# Patient Record
Sex: Female | Born: 1975 | Race: White | Hispanic: No | Marital: Married | Smoking: Former smoker
Health system: Southern US, Community
[De-identification: ages and names within clinical notes are randomized; demographics above are authoritative.]

## PROBLEM LIST (undated history)

## (undated) DIAGNOSIS — F32A Depression, unspecified: Secondary | ICD-10-CM

## (undated) DIAGNOSIS — F1020 Alcohol dependence, uncomplicated: Secondary | ICD-10-CM

## (undated) DIAGNOSIS — F329 Major depressive disorder, single episode, unspecified: Secondary | ICD-10-CM

## (undated) DIAGNOSIS — F4310 Post-traumatic stress disorder, unspecified: Secondary | ICD-10-CM

## (undated) DIAGNOSIS — F419 Anxiety disorder, unspecified: Secondary | ICD-10-CM

## (undated) DIAGNOSIS — F319 Bipolar disorder, unspecified: Secondary | ICD-10-CM

## (undated) DIAGNOSIS — J45909 Unspecified asthma, uncomplicated: Secondary | ICD-10-CM

## (undated) DIAGNOSIS — R569 Unspecified convulsions: Secondary | ICD-10-CM

## (undated) DIAGNOSIS — F1921 Other psychoactive substance dependence, in remission: Secondary | ICD-10-CM

## (undated) DIAGNOSIS — Z915 Personal history of self-harm: Secondary | ICD-10-CM

## (undated) HISTORY — PX: CHOLECYSTECTOMY: SHX55

## (undated) HISTORY — PX: TONSILLECTOMY: SUR1361

## (undated) HISTORY — PX: OTHER SURGICAL HISTORY: SHX169

---

## 1998-01-23 ENCOUNTER — Ambulatory Visit (HOSPITAL_COMMUNITY): Admission: RE | Admit: 1998-01-23 | Discharge: 1998-01-23 | Payer: Self-pay | Admitting: Obstetrics and Gynecology

## 1998-06-27 ENCOUNTER — Ambulatory Visit (HOSPITAL_COMMUNITY): Admission: RE | Admit: 1998-06-27 | Discharge: 1998-06-27 | Payer: Self-pay | Admitting: Obstetrics and Gynecology

## 1998-06-28 ENCOUNTER — Inpatient Hospital Stay (HOSPITAL_COMMUNITY): Admission: AD | Admit: 1998-06-28 | Discharge: 1998-07-01 | Payer: Self-pay | Admitting: Obstetrics and Gynecology

## 2003-12-03 DIAGNOSIS — Z9151 Personal history of suicidal behavior: Secondary | ICD-10-CM

## 2003-12-03 HISTORY — DX: Personal history of suicidal behavior: Z91.51

## 2006-10-06 ENCOUNTER — Other Ambulatory Visit: Admission: RE | Admit: 2006-10-06 | Discharge: 2006-10-06 | Payer: Self-pay | Admitting: Gynecology

## 2009-10-02 ENCOUNTER — Ambulatory Visit (HOSPITAL_BASED_OUTPATIENT_CLINIC_OR_DEPARTMENT_OTHER): Admission: RE | Admit: 2009-10-02 | Discharge: 2009-10-02 | Payer: Self-pay | Admitting: Internal Medicine

## 2009-10-02 ENCOUNTER — Ambulatory Visit: Payer: Self-pay | Admitting: Diagnostic Radiology

## 2009-10-02 ENCOUNTER — Ambulatory Visit: Payer: Self-pay | Admitting: Family

## 2009-10-02 DIAGNOSIS — Z9189 Other specified personal risk factors, not elsewhere classified: Secondary | ICD-10-CM

## 2009-10-02 DIAGNOSIS — R5383 Other fatigue: Secondary | ICD-10-CM

## 2009-10-02 DIAGNOSIS — R209 Unspecified disturbances of skin sensation: Secondary | ICD-10-CM

## 2009-10-02 DIAGNOSIS — R413 Other amnesia: Secondary | ICD-10-CM

## 2009-10-02 DIAGNOSIS — F064 Anxiety disorder due to known physiological condition: Secondary | ICD-10-CM | POA: Insufficient documentation

## 2009-10-02 DIAGNOSIS — R5381 Other malaise: Secondary | ICD-10-CM | POA: Insufficient documentation

## 2009-10-02 DIAGNOSIS — N879 Dysplasia of cervix uteri, unspecified: Secondary | ICD-10-CM | POA: Insufficient documentation

## 2009-10-02 LAB — CONVERTED CEMR LAB
BUN: 9 mg/dL (ref 6–23)
Basophils Absolute: 0.1 10*3/uL (ref 0.0–0.1)
Calcium: 9.3 mg/dL (ref 8.4–10.5)
Chloride: 108 meq/L (ref 96–112)
Glucose, Bld: 75 mg/dL (ref 70–99)
Lymphocytes Relative: 20 % (ref 12–46)
Lymphs Abs: 1.8 10*3/uL (ref 0.7–4.0)
MCHC: 33.6 g/dL (ref 30.0–36.0)
Monocytes Absolute: 0.6 10*3/uL (ref 0.1–1.0)
Monocytes Relative: 6 % (ref 3–12)
Neutro Abs: 6 10*3/uL (ref 1.7–7.7)
Potassium: 4.4 meq/L (ref 3.5–5.3)
RDW: 12.3 % (ref 11.5–15.5)

## 2009-10-03 ENCOUNTER — Encounter: Payer: Self-pay | Admitting: Family

## 2010-02-15 ENCOUNTER — Emergency Department (HOSPITAL_COMMUNITY): Admission: EM | Admit: 2010-02-15 | Discharge: 2010-02-16 | Payer: Self-pay | Admitting: Emergency Medicine

## 2011-01-29 ENCOUNTER — Emergency Department (INDEPENDENT_AMBULATORY_CARE_PROVIDER_SITE_OTHER): Payer: Self-pay

## 2011-01-29 ENCOUNTER — Emergency Department (HOSPITAL_BASED_OUTPATIENT_CLINIC_OR_DEPARTMENT_OTHER)
Admission: EM | Admit: 2011-01-29 | Discharge: 2011-01-29 | Disposition: A | Discharge status: Home / Self Care | Payer: Self-pay | Attending: Emergency Medicine | Admitting: Emergency Medicine

## 2011-01-29 DIAGNOSIS — R0989 Other specified symptoms and signs involving the circulatory and respiratory systems: Secondary | ICD-10-CM

## 2011-01-29 DIAGNOSIS — F341 Dysthymic disorder: Secondary | ICD-10-CM | POA: Insufficient documentation

## 2011-01-29 DIAGNOSIS — R059 Cough, unspecified: Secondary | ICD-10-CM | POA: Insufficient documentation

## 2011-01-29 DIAGNOSIS — R197 Diarrhea, unspecified: Secondary | ICD-10-CM | POA: Insufficient documentation

## 2011-01-29 DIAGNOSIS — R05 Cough: Secondary | ICD-10-CM

## 2011-01-29 DIAGNOSIS — R112 Nausea with vomiting, unspecified: Secondary | ICD-10-CM | POA: Insufficient documentation

## 2011-01-29 DIAGNOSIS — R0602 Shortness of breath: Secondary | ICD-10-CM | POA: Insufficient documentation

## 2011-01-29 LAB — BASIC METABOLIC PANEL
Creatinine, Ser: 1 mg/dL (ref 0.4–1.2)
GFR calc non Af Amer: >60 mL/min (ref >60)
Glucose, Bld: 108 mg/dL — ABNORMAL HIGH (ref 70–99)
Potassium: 3.6 mEq/L (ref 3.5–5.1)

## 2011-01-29 LAB — PREGNANCY, URINE: Preg Test, Ur: NEGATIVE

## 2011-01-29 LAB — URINE MICROSCOPIC-ADD ON

## 2011-01-29 LAB — URINALYSIS, ROUTINE W REFLEX MICROSCOPIC
Ketones, ur: NEGATIVE mg/dL
Nitrite: NEGATIVE
Protein, ur: 30 mg/dL — AB
Urine Glucose, Fasting: NEGATIVE mg/dL
Urobilinogen, UA: 0.2 mg/dL (ref 0.0–1.0)

## 2011-02-25 LAB — URINALYSIS, ROUTINE W REFLEX MICROSCOPIC
Glucose, UA: NEGATIVE mg/dL
Ketones, ur: NEGATIVE mg/dL
Leukocytes, UA: NEGATIVE
Nitrite: NEGATIVE
Protein, ur: NEGATIVE mg/dL
Urobilinogen, UA: 1 mg/dL (ref 0.0–1.0)

## 2011-02-25 LAB — ACETAMINOPHEN LEVEL: Acetaminophen (Tylenol), Serum: <10 ug/mL — ABNORMAL LOW (ref 10–30)

## 2011-02-25 LAB — COMPREHENSIVE METABOLIC PANEL
ALT: 10 U/L (ref 0–35)
Alkaline Phosphatase: 54 U/L (ref 39–117)
BUN: 8 mg/dL (ref 6–23)
CO2: 28 mEq/L (ref 19–32)
Calcium: 8.9 mg/dL (ref 8.4–10.5)
Creatinine, Ser: 0.94 mg/dL (ref 0.4–1.2)
GFR calc Af Amer: >60 mL/min (ref >60)
Glucose, Bld: 97 mg/dL (ref 70–99)

## 2011-02-25 LAB — DIFFERENTIAL
Basophils Relative: 1 % (ref 0–1)
Eosinophils Absolute: 0.1 10*3/uL (ref 0.0–0.7)
Eosinophils Relative: 1 % (ref 0–5)
Lymphs Abs: 1.3 10*3/uL (ref 0.7–4.0)
Monocytes Absolute: 0.4 10*3/uL (ref 0.1–1.0)
Neutro Abs: 8.7 10*3/uL — ABNORMAL HIGH (ref 1.7–7.7)

## 2011-02-25 LAB — URINE MICROSCOPIC-ADD ON

## 2011-02-25 LAB — CBC
HCT: 46.6 % — ABNORMAL HIGH (ref 36.0–46.0)
MCV: 88.4 fL (ref 78.0–100.0)
RBC: 5.28 MIL/uL — ABNORMAL HIGH (ref 3.87–5.11)
WBC: 10.5 10*3/uL (ref 4.0–10.5)

## 2011-02-25 LAB — RAPID URINE DRUG SCREEN, HOSP PERFORMED: Amphetamines: NOT DETECTED

## 2011-02-25 LAB — POCT PREGNANCY, URINE: Preg Test, Ur: NEGATIVE

## 2011-02-25 LAB — SALICYLATE LEVEL: Salicylate Lvl: <4 mg/dL (ref 2.8–20.0)

## 2011-08-01 ENCOUNTER — Other Ambulatory Visit (HOSPITAL_COMMUNITY)
Admission: RE | Admit: 2011-08-01 | Discharge: 2011-08-01 | Disposition: A | Discharge status: Home / Self Care | Payer: 59 | Source: Ambulatory Visit | Attending: Internal Medicine | Admitting: Internal Medicine

## 2011-08-01 DIAGNOSIS — Z01419 Encounter for gynecological examination (general) (routine) without abnormal findings: Secondary | ICD-10-CM | POA: Insufficient documentation

## 2012-12-03 ENCOUNTER — Emergency Department (HOSPITAL_BASED_OUTPATIENT_CLINIC_OR_DEPARTMENT_OTHER)
Admission: EM | Admit: 2012-12-03 | Discharge: 2012-12-04 | Disposition: A | Discharge status: Home / Self Care | Payer: 59 | Attending: Emergency Medicine | Admitting: Emergency Medicine

## 2012-12-03 ENCOUNTER — Encounter (HOSPITAL_BASED_OUTPATIENT_CLINIC_OR_DEPARTMENT_OTHER): Payer: Self-pay | Admitting: *Deleted

## 2012-12-03 DIAGNOSIS — F319 Bipolar disorder, unspecified: Secondary | ICD-10-CM | POA: Insufficient documentation

## 2012-12-03 DIAGNOSIS — F3289 Other specified depressive episodes: Secondary | ICD-10-CM | POA: Insufficient documentation

## 2012-12-03 DIAGNOSIS — Z9089 Acquired absence of other organs: Secondary | ICD-10-CM | POA: Insufficient documentation

## 2012-12-03 DIAGNOSIS — F1921 Other psychoactive substance dependence, in remission: Secondary | ICD-10-CM | POA: Insufficient documentation

## 2012-12-03 DIAGNOSIS — Z87891 Personal history of nicotine dependence: Secondary | ICD-10-CM | POA: Insufficient documentation

## 2012-12-03 DIAGNOSIS — F329 Major depressive disorder, single episode, unspecified: Secondary | ICD-10-CM | POA: Insufficient documentation

## 2012-12-03 DIAGNOSIS — L02219 Cutaneous abscess of trunk, unspecified: Secondary | ICD-10-CM | POA: Insufficient documentation

## 2012-12-03 DIAGNOSIS — F411 Generalized anxiety disorder: Secondary | ICD-10-CM | POA: Insufficient documentation

## 2012-12-03 DIAGNOSIS — L03319 Cellulitis of trunk, unspecified: Secondary | ICD-10-CM | POA: Insufficient documentation

## 2012-12-03 DIAGNOSIS — F1020 Alcohol dependence, uncomplicated: Secondary | ICD-10-CM | POA: Insufficient documentation

## 2012-12-03 DIAGNOSIS — L0291 Cutaneous abscess, unspecified: Secondary | ICD-10-CM

## 2012-12-03 HISTORY — DX: Major depressive disorder, single episode, unspecified: F32.9

## 2012-12-03 HISTORY — DX: Alcohol dependence, uncomplicated: F10.20

## 2012-12-03 HISTORY — DX: Bipolar disorder, unspecified: F31.9

## 2012-12-03 HISTORY — DX: Anxiety disorder, unspecified: F41.9

## 2012-12-03 HISTORY — DX: Depression, unspecified: F32.A

## 2012-12-03 HISTORY — DX: Other psychoactive substance dependence, in remission: F19.21

## 2012-12-03 NOTE — ED Notes (Signed)
Abscess to her abdomen x 1 week. Family member was admitted with staph infection last week.

## 2012-12-04 MED ORDER — CEPHALEXIN 500 MG PO CAPS: 500.0000 mg | ORAL_CAPSULE | Freq: Four times a day (QID) | ORAL | Status: DC

## 2012-12-04 MED ORDER — DOXYCYCLINE HYCLATE 100 MG PO CAPS: 100.0000 mg | ORAL_CAPSULE | Freq: Two times a day (BID) | ORAL | Status: DC

## 2012-12-04 MED ORDER — LIDOCAINE-EPINEPHRINE 2 %-1:100000 IJ SOLN
INTRAMUSCULAR | Status: AC
  Administered 2012-12-04: 01:00:00
  Filled 2012-12-04: qty 1

## 2012-12-04 NOTE — ED Notes (Signed)
MD at bedside. 

## 2012-12-04 NOTE — ED Provider Notes (Addendum)
History     CSN: [id number]  Arrival date & time 12/03/12  2127   First MD Initiated Contact with Patient 12/03/12 2358      Chief Complaint  Patient presents with  . Abscess    (Consider location/radiation/quality/duration/timing/severity/associated sxs/prior treatment) Patient is a 37 y.o. female presenting with abscess. The history is provided by the patient. No language interpreter was used.  Abscess  This is a new problem. The current episode started more than one week ago. The onset was gradual. The problem occurs continuously. The problem has been gradually worsening. The abscess is present on the abdomen. The problem is moderate. The abscess is characterized by itchiness. Associated with: none. The abscess first occurred at home. Pertinent negatives include no anorexia and no fussiness. Her past medical history is significant for skin abscesses in family. There were no sick contacts. She has received no recent medical care.    Past Medical History  Diagnosis Date  . Anxiety   . Depression   . Alcoholic   . Drug addiction in remission   . Bipolar 1 disorder     Past Surgical History  Procedure Date  . Cholecystectomy   . Tonsillectomy     No family history on file.  History  Substance Use Topics  . Smoking status: Former Games developer  . Smokeless tobacco: Not on file  . Alcohol Use: No    OB History    Grav Para Term Preterm Abortions TAB SAB Ect Mult Living                  Review of Systems  Gastrointestinal: Negative for anorexia.  Skin: Positive for wound.  All other systems reviewed and are negative.    Allergies  Review of patient's allergies indicates no known allergies.  Home Medications   Current Outpatient Rx  Name  Route  Sig  Dispense  Refill  . WELLBUTRIN PO   Oral   Take by mouth.         . CYMBALTA PO   Oral   Take by mouth.         Marland Kitchen VISTARIL PO   Oral   Take by mouth.           BP 125/77  Pulse 95  Temp 98.2 F  (36.8 C) (Oral)  Resp 20  SpO2 98%  Physical Exam  Constitutional: She is oriented to person, place, and time. She appears well-developed and well-nourished. No distress.  HENT:  Head: Normocephalic and atraumatic.  Mouth/Throat: Oropharynx is clear and moist.  Eyes: Conjunctivae normal are normal. Pupils are equal, round, and reactive to light.  Neck: Neck supple.  Cardiovascular: Normal rate, regular rhythm and intact distal pulses.   Pulmonary/Chest: Effort normal and breath sounds normal.  Abdominal: Soft. Bowel sounds are normal. There is no tenderness. There is no rebound and no guarding.    Musculoskeletal: Normal range of motion.  Neurological: She is alert and oriented to person, place, and time.  Skin: Skin is warm and dry.  Psychiatric: She has a normal mood and affect.    ED Course  Procedures (including critical care time)  Labs Reviewed - No data to display No results found.   No diagnosis found.    MDM  INCISION AND DRAINAGE Performed by: Jasmine Awe Consent: Verbal consent obtained. Risks and benefits: risks, benefits and alternatives were discussed Type: abscess  Body area: right abdominal wall  Anesthesia: local infiltration, aspiration without blood prior to injection  Incision was made with a scalpel, very superficial incision as already draining.  Local anesthetic: lidocaine 1 % with epinephrine  Anesthetic total: 4 ml  Complexity: complex Blunt dissection to break up loculations  Drainage: purulent  Drainage amount: ~ 10 ml-- large   Packing material: 1/4 in iodoform gauze  Patient tolerance: Patient tolerated the procedure well with no immediate complications.    Return for packing removal in 48 hours.  Return sooner for fevers, streaking or any concerns.  Patient verbalizes understanding and agrees to follow up      Ople Girgis K Kayston Jodoin-Rasch, MD 12/04/12 0055  Chaniah Cisse K Keya Wynes-Rasch, MD 12/04/12 1914  Jasmine Awe, MD 12/04/12 [phone]

## 2012-12-05 ENCOUNTER — Emergency Department (HOSPITAL_BASED_OUTPATIENT_CLINIC_OR_DEPARTMENT_OTHER)
Admission: EM | Admit: 2012-12-05 | Discharge: 2012-12-05 | Disposition: A | Discharge status: Home / Self Care | Payer: 59 | Attending: Emergency Medicine | Admitting: Emergency Medicine

## 2012-12-05 ENCOUNTER — Encounter (HOSPITAL_BASED_OUTPATIENT_CLINIC_OR_DEPARTMENT_OTHER): Payer: Self-pay | Admitting: *Deleted

## 2012-12-05 DIAGNOSIS — Z5189 Encounter for other specified aftercare: Secondary | ICD-10-CM

## 2012-12-05 DIAGNOSIS — F319 Bipolar disorder, unspecified: Secondary | ICD-10-CM | POA: Insufficient documentation

## 2012-12-05 DIAGNOSIS — F411 Generalized anxiety disorder: Secondary | ICD-10-CM | POA: Insufficient documentation

## 2012-12-05 DIAGNOSIS — Z87891 Personal history of nicotine dependence: Secondary | ICD-10-CM | POA: Insufficient documentation

## 2012-12-05 DIAGNOSIS — F329 Major depressive disorder, single episode, unspecified: Secondary | ICD-10-CM | POA: Insufficient documentation

## 2012-12-05 DIAGNOSIS — F3289 Other specified depressive episodes: Secondary | ICD-10-CM | POA: Insufficient documentation

## 2012-12-05 DIAGNOSIS — F1020 Alcohol dependence, uncomplicated: Secondary | ICD-10-CM | POA: Insufficient documentation

## 2012-12-05 DIAGNOSIS — Z4800 Encounter for change or removal of nonsurgical wound dressing: Secondary | ICD-10-CM | POA: Insufficient documentation

## 2012-12-05 DIAGNOSIS — Z79899 Other long term (current) drug therapy: Secondary | ICD-10-CM | POA: Insufficient documentation

## 2012-12-05 DIAGNOSIS — F1921 Other psychoactive substance dependence, in remission: Secondary | ICD-10-CM | POA: Insufficient documentation

## 2012-12-05 NOTE — ED Provider Notes (Signed)
History     CSN: [id number]  Arrival date & time 12/05/12  1629   First MD Initiated Contact with Patient 12/05/12 1727      Chief Complaint  Patient presents with  . Wound Check    (Consider location/radiation/quality/duration/timing/severity/associated sxs/prior treatment) HPI Comments: 37 year old female presents to the emergency department for recheck and packing removal of an abscess under her abdomen that was drained 2 days ago. She was placed on doxycycline and Keflex and is taking both as prescribed. States the area today is more painful since she worked all day yesterday. She has not tried any alleviating factors for her pain. Denies drainage. She's been keeping the area clean and dry. No fever or chills.  Patient is a 37 y.o. female presenting with wound check. The history is provided by the patient.  Wound Check     Past Medical History  Diagnosis Date  . Anxiety   . Depression   . Alcoholic   . Drug addiction in remission   . Bipolar 1 disorder     Past Surgical History  Procedure Date  . Cholecystectomy   . Tonsillectomy     History reviewed. No pertinent family history.  History  Substance Use Topics  . Smoking status: Former Games developer  . Smokeless tobacco: Not on file  . Alcohol Use: No    OB History    Grav Para Term Preterm Abortions TAB SAB Ect Mult Living                  Review of Systems  Constitutional: Negative for fever and chills.  Gastrointestinal: Negative for nausea, vomiting and abdominal pain.  Musculoskeletal: Negative for arthralgias.  Skin: Negative for color change.    Allergies  Review of patient's allergies indicates no known allergies.  Home Medications   Current Outpatient Rx  Name  Route  Sig  Dispense  Refill  . WELLBUTRIN PO   Oral   Take by mouth.         . CEPHALEXIN 500 MG PO CAPS   Oral   Take 1 capsule (500 mg total) by mouth 4 (four) times daily.   28 capsule   0   . DOXYCYCLINE HYCLATE 100 MG PO  CAPS   Oral   Take 1 capsule (100 mg total) by mouth 2 (two) times daily. One po bid x 7 days   14 capsule   0   . CYMBALTA PO   Oral   Take by mouth.         Marland Kitchen VISTARIL PO   Oral   Take by mouth.           BP 132/66  Pulse 81  Temp 98.6 F (37 C) (Oral)  Resp 16  Ht 5\' 3"  (1.6 m)  Wt 176 lb (79.833 kg)  BMI 31.18 kg/m2  SpO2 100%  Physical Exam  Nursing note and vitals reviewed. Constitutional: She appears well-developed and well-nourished. No distress.  HENT:  Head: Normocephalic and atraumatic.  Eyes: Conjunctivae normal are normal.  Neck: Normal range of motion. Neck supple.  Cardiovascular: Normal rate, regular rhythm and normal heart sounds.   Pulmonary/Chest: Effort normal and breath sounds normal.  Abdominal: Soft. Bowel sounds are normal. There is no tenderness.    Musculoskeletal: Normal range of motion. She exhibits no edema.  Neurological: She is alert.  Skin: Skin is warm and dry.       See abdomen.  Psychiatric: She has a normal mood and affect.  Her behavior is normal.    ED Course  Procedures (including critical care time)  Labs Reviewed - No data to display No results found.   1. Wound check, abscess       MDM  37 y/o female with well healing abscess. Packing removed. No active drainage. Afebrile. Advised warm compresses. Return precautions discussed. Aware to complete antibiotics. Patient states understanding of plan and is agreeable.         Trevor Mace, PA-C 12/05/12 1747

## 2012-12-05 NOTE — ED Notes (Signed)
Pt here for wound recheck last seen x 2 days ago

## 2012-12-05 NOTE — ED Provider Notes (Signed)
Medical screening examination/treatment/procedure(s) were performed by non-physician practitioner and as supervising physician I was immediately available for consultation/collaboration.   Deneise Getty, MD 12/05/12 2347 

## 2012-12-05 NOTE — ED Notes (Signed)
Wound care discussed. Instructed to finish antibiotics- pt verbalizes understanding

## 2013-01-05 ENCOUNTER — Emergency Department (HOSPITAL_COMMUNITY)
Admission: EM | Admit: 2013-01-05 | Discharge: 2013-01-06 | Disposition: A | Discharge status: Other Acute Inpatient Hospital | Payer: Self-pay | Attending: Emergency Medicine | Admitting: Emergency Medicine

## 2013-01-05 ENCOUNTER — Encounter (HOSPITAL_COMMUNITY): Payer: Self-pay

## 2013-01-05 DIAGNOSIS — F32A Depression, unspecified: Secondary | ICD-10-CM

## 2013-01-05 DIAGNOSIS — F22 Delusional disorders: Secondary | ICD-10-CM

## 2013-01-05 DIAGNOSIS — F329 Major depressive disorder, single episode, unspecified: Secondary | ICD-10-CM

## 2013-01-05 DIAGNOSIS — R45851 Suicidal ideations: Secondary | ICD-10-CM

## 2013-01-05 DIAGNOSIS — Z87891 Personal history of nicotine dependence: Secondary | ICD-10-CM | POA: Insufficient documentation

## 2013-01-05 DIAGNOSIS — F3013 Manic episode, severe, without psychotic symptoms: Secondary | ICD-10-CM | POA: Insufficient documentation

## 2013-01-05 DIAGNOSIS — F3010 Manic episode without psychotic symptoms, unspecified: Secondary | ICD-10-CM

## 2013-01-05 DIAGNOSIS — F064 Anxiety disorder due to known physiological condition: Secondary | ICD-10-CM

## 2013-01-05 DIAGNOSIS — Z79899 Other long term (current) drug therapy: Secondary | ICD-10-CM | POA: Insufficient documentation

## 2013-01-05 DIAGNOSIS — F1921 Other psychoactive substance dependence, in remission: Secondary | ICD-10-CM | POA: Insufficient documentation

## 2013-01-05 DIAGNOSIS — F1020 Alcohol dependence, uncomplicated: Secondary | ICD-10-CM | POA: Insufficient documentation

## 2013-01-05 DIAGNOSIS — F4310 Post-traumatic stress disorder, unspecified: Secondary | ICD-10-CM

## 2013-01-05 LAB — CBC WITH DIFFERENTIAL/PLATELET
Basophils Absolute: 0.1 10*3/uL (ref 0.0–0.1)
Basophils Relative: 1 % (ref 0–1)
Eosinophils Absolute: 0.2 10*3/uL (ref 0.0–0.7)
Eosinophils Relative: 1 % (ref 0–5)
MCH: 28.7 pg (ref 26.0–34.0)
MCHC: 33.1 g/dL (ref 30.0–36.0)
MCV: 86.5 fL (ref 78.0–100.0)
Monocytes Absolute: 1.2 10*3/uL — ABNORMAL HIGH (ref 0.1–1.0)
Platelets: 314 10*3/uL (ref 150–400)
RDW: 12.9 % (ref 11.5–15.5)
WBC: 12.6 10*3/uL — ABNORMAL HIGH (ref 4.0–10.5)

## 2013-01-05 LAB — COMPREHENSIVE METABOLIC PANEL
ALT: 10 U/L (ref 0–35)
AST: 11 U/L (ref 0–37)
Calcium: 8.7 mg/dL (ref 8.4–10.5)
Creatinine, Ser: 1.06 mg/dL (ref 0.50–1.10)
Sodium: 138 mEq/L (ref 135–145)
Total Protein: 7.3 g/dL (ref 6.0–8.3)

## 2013-01-05 LAB — RAPID URINE DRUG SCREEN, HOSP PERFORMED
Amphetamines: NOT DETECTED
Barbiturates: NOT DETECTED
Benzodiazepines: NOT DETECTED
Cocaine: NOT DETECTED

## 2013-01-05 LAB — ETHANOL: Alcohol, Ethyl (B): <11 mg/dL (ref 0–11)

## 2013-01-05 MED ORDER — ALUM & MAG HYDROXIDE-SIMETH 200-200-20 MG/5ML PO SUSP
30.0000 mL | ORAL | Status: DC | PRN
Start: 2013-01-05 — End: 2013-01-06

## 2013-01-05 MED ORDER — ZOLPIDEM TARTRATE 5 MG PO TABS
5.0000 mg | ORAL_TABLET | Freq: Every evening | ORAL | Status: DC | PRN
  Administered 2013-01-05: 5 mg via ORAL
  Filled 2013-01-05: qty 1

## 2013-01-05 MED ORDER — BUPROPION HCL ER (SR) 150 MG PO TB12
150.0000 mg | ORAL_TABLET | Freq: Two times a day (BID) | ORAL | Status: DC
  Administered 2013-01-06 (×2): 150 mg via ORAL
  Filled 2013-01-05 (×3): qty 1

## 2013-01-05 MED ORDER — DULOXETINE HCL 30 MG PO CPEP
30.0000 mg | ORAL_CAPSULE | Freq: Every day | ORAL | Status: DC
  Administered 2013-01-06: 30 mg via ORAL
  Filled 2013-01-05: qty 1

## 2013-01-05 MED ORDER — LORAZEPAM 2 MG/ML IJ SOLN
1.0000 mg | Freq: Once | INTRAMUSCULAR | Status: AC
  Administered 2013-01-05: 1 mg via INTRAMUSCULAR
  Filled 2013-01-05: qty 1

## 2013-01-05 MED ORDER — ONDANSETRON HCL 4 MG PO TABS: 4.0000 mg | ORAL_TABLET | Freq: Three times a day (TID) | ORAL | Status: DC | PRN

## 2013-01-05 MED ORDER — ACETAMINOPHEN 325 MG PO TABS: 650.0000 mg | ORAL_TABLET | ORAL | Status: DC | PRN

## 2013-01-05 MED ORDER — NICOTINE 21 MG/24HR TD PT24
21.0000 mg | MEDICATED_PATCH | Freq: Every day | TRANSDERMAL | Status: DC
  Filled 2013-01-05: qty 1

## 2013-01-05 MED ORDER — IBUPROFEN 600 MG PO TABS: 600.0000 mg | ORAL_TABLET | Freq: Three times a day (TID) | ORAL | Status: DC | PRN

## 2013-01-05 NOTE — Progress Notes (Signed)
Pt accepted to [facility] pending 400 hall bed. Per discussion with Minerva Areola, bed anticipated to be available tomorrow. CSW will inform patient.   Catha Gosselin, LCSWA  [phone] .01/05/2013 10:29pm

## 2013-01-05 NOTE — ED Provider Notes (Signed)
History   This chart was scribed for Loretta Hood, a non-physician practitioner working with Loretta Hood, * by Lewanda Rife, ED Scribe. This patient was seen in room [facility] and the patient's care was started at 3:59 pm.     CSN: [id number]  Arrival date & time 01/05/13  1439   None     Chief Complaint  Patient presents with  . Depression  . Medical Clearance  . Suicidal    (Consider location/radiation/quality/duration/timing/severity/associated sxs/prior treatment) HPI [name] is a 37 y.o. female who presents to the Emergency Department complaining of worsening depression onset 3 weeks. Pt reports she is afraid all the time and does not know why. Pt reports she called her therapist today "with a crisis." Pt reports visual hallucinations, paranoia, insomnia, anxiety, depression, and suicidal ideations. Pt denies homicidal ideations. Pt states precipitating factor was in June when she had a breakdown after her boyfriend broke up with her. Pt reports she's a recovering addict and had a relapse in November. Pt reports medications were recently changed and states her symptoms are not improving.    Past Medical History  Diagnosis Date  . Anxiety   . Depression   . Alcoholic   . Drug addiction in remission   . Bipolar 1 disorder     Past Surgical History  Procedure Date  . Cholecystectomy   . Tonsillectomy     No family history on file.  History  Substance Use Topics  . Smoking status: Former Games developer  . Smokeless tobacco: Never Used  . Alcohol Use: No    OB History    Grav Para Term Preterm Abortions TAB SAB Ect Mult Living                  Review of Systems  Constitutional: Negative.   HENT: Negative.   Respiratory: Negative.   Cardiovascular: Negative.   Gastrointestinal: Negative.   Musculoskeletal: Negative.   Skin: Negative.   Neurological: Negative.   Hematological: Negative.   Psychiatric/Behavioral: Positive for  suicidal ideas and hallucinations. The patient is nervous/anxious and is hyperactive.   All other systems reviewed and are negative.   A complete 10 system review of systems was obtained and all systems are negative except as noted in the HPI and PMH.    Allergies  Review of patient's allergies indicates no known allergies.  Home Medications   Current Outpatient Rx  Name  Route  Sig  Dispense  Refill  . BUPROPION HCL ER (SR) 150 MG PO TB12   Oral   Take 150 mg by mouth 2 (two) times daily.         . WELLBUTRIN PO   Oral   Take by mouth.         . CEPHALEXIN 500 MG PO CAPS   Oral   Take 1 capsule (500 mg total) by mouth 4 (four) times daily.   28 capsule   0   . DULOXETINE HCL 30 MG PO CPEP   Oral   Take 30 mg by mouth daily.         Marland Kitchen HYDROXYZINE HCL 25 MG PO TABS   Oral   Take 25 mg by mouth every 4 (four) hours as needed.         Marland Kitchen DOXYCYCLINE HYCLATE 100 MG PO CAPS   Oral   Take 1 capsule (100 mg total) by mouth 2 (two) times daily. One po bid x 7 days   14 capsule  0     BP 138/91  Pulse 117  Temp 98.9 F (37.2 C) (Oral)  Resp 22  SpO2 96%  LMP 12/05/2012  Physical Exam  Nursing note and vitals reviewed. Constitutional: She is oriented to person, place, and time. She appears well-developed and well-nourished. No distress.  HENT:  Head: Normocephalic and atraumatic.  Eyes: EOM are normal.  Neck: Neck supple. No tracheal deviation present.  Cardiovascular: Tachycardia present.   Pulmonary/Chest: Effort normal. No respiratory distress.  Abdominal: Soft.  Musculoskeletal: Normal range of motion.  Neurological: She is alert and oriented to person, place, and time.  Skin: Skin is warm and dry.  Psychiatric: Her mood appears anxious. Her speech is rapid and/or pressured. She is is hyperactive and actively hallucinating. Thought content is paranoid. She expresses suicidal ideation. She expresses no homicidal ideation. She expresses no suicidal  plans and no homicidal plans.    ED Course  Procedures (including critical care time)  Medications  buPROPion (WELLBUTRIN SR) 150 MG 12 hr tablet (not administered)  hydrOXYzine (ATARAX/VISTARIL) 25 MG tablet (not administered)  DULoxetine (CYMBALTA) 30 MG capsule (not administered)  LORazepam (ATIVAN) injection 1 mg (not administered)  ibuprofen (ADVIL,MOTRIN) tablet 600 mg (not administered)  acetaminophen (TYLENOL) tablet 650 mg (not administered)  zolpidem (AMBIEN) tablet 5 mg (not administered)  nicotine (NICODERM CQ - dosed in mg/24 hours) patch 21 mg (not administered)  ondansetron (ZOFRAN) tablet 4 mg (not administered)  alum & mag hydroxide-simeth (MAALOX/MYLANTA) 200-200-20 MG/5ML suspension 30 mL (not administered)    Labs Reviewed  CBC WITH DIFFERENTIAL - Abnormal; Notable for the following:    WBC 12.6 (*)     RBC 5.27 (*)     Hemoglobin 15.1 (*)     Neutro Abs 9.1 (*)     Monocytes Absolute 1.2 (*)     All other components within normal limits  COMPREHENSIVE METABOLIC PANEL - Abnormal; Notable for the following:    Glucose, Bld 128 (*)     GFR calc non Af Amer 66 (*)     GFR calc Af Amer 77 (*)     All other components within normal limits  ETHANOL  URINE RAPID DRUG SCREEN (HOSP PERFORMED)  PREGNANCY, URINE   No results found.   1. Manic behavior   2. Paranoid   3. PTSD (post-traumatic stress disorder)   4. Suicidal ideation   5. Depression       MDM  Holding orders placed. ACT consulted.  2mg  IM Ativan for anxiety ordered.  I personally performed the services described in this documentation, which was scribed in my presence. The recorded information has been reviewed and is accurate.    Dorthula Matas, PA 01/05/13 [phone]

## 2013-01-05 NOTE — ED Notes (Signed)
Patient reports that she was sent here by her therapist. Patient states she is on the verge of a nervous breakdown and repeated several times that she did not want to live llike this. Patient has stuttering speech, tearful and states she does not want anyone touching her. Patient denies HI. Patient states her medicine was changed 3 weeks ago and states her symptoms of depression have gotten worse.
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
## 2013-01-05 NOTE — ED Notes (Signed)
Family took belongings bags x 2 home.

## 2013-01-05 NOTE — ED Provider Notes (Signed)
Medical screening examination/treatment/procedure(s) were performed by non-physician practitioner and as supervising physician I was immediately available for consultation/collaboration.   [name], MD 01/05/13 [phone]

## 2013-01-06 ENCOUNTER — Inpatient Hospital Stay (HOSPITAL_COMMUNITY)
Admission: AD | Admit: 2013-01-06 | Discharge: 2013-01-15 | DRG: 881 | Disposition: A | Discharge status: Home / Self Care | Payer: Federal, State, Local not specified - Other | Source: Intra-hospital | Attending: Psychiatry | Admitting: Psychiatry

## 2013-01-06 ENCOUNTER — Encounter (HOSPITAL_COMMUNITY): Payer: Self-pay | Admitting: Emergency Medicine

## 2013-01-06 DIAGNOSIS — F419 Anxiety disorder, unspecified: Secondary | ICD-10-CM

## 2013-01-06 DIAGNOSIS — F1910 Other psychoactive substance abuse, uncomplicated: Secondary | ICD-10-CM

## 2013-01-06 DIAGNOSIS — F4310 Post-traumatic stress disorder, unspecified: Secondary | ICD-10-CM

## 2013-01-06 DIAGNOSIS — F3289 Other specified depressive episodes: Principal | ICD-10-CM | POA: Diagnosis present

## 2013-01-06 DIAGNOSIS — Z79899 Other long term (current) drug therapy: Secondary | ICD-10-CM

## 2013-01-06 DIAGNOSIS — F329 Major depressive disorder, single episode, unspecified: Secondary | ICD-10-CM

## 2013-01-06 DIAGNOSIS — F411 Generalized anxiety disorder: Secondary | ICD-10-CM | POA: Diagnosis present

## 2013-01-06 DIAGNOSIS — F32A Depression, unspecified: Secondary | ICD-10-CM

## 2013-01-06 DIAGNOSIS — F319 Bipolar disorder, unspecified: Secondary | ICD-10-CM

## 2013-01-06 DIAGNOSIS — IMO0002 Reserved for concepts with insufficient information to code with codable children: Secondary | ICD-10-CM

## 2013-01-06 MED ORDER — QUETIAPINE FUMARATE 100 MG PO TABS: 100.0000 mg | ORAL_TABLET | Freq: Every day | ORAL | Status: DC

## 2013-01-06 MED ORDER — HYDROXYZINE HCL 25 MG PO TABS
25.0000 mg | ORAL_TABLET | Freq: Four times a day (QID) | ORAL | Status: DC | PRN
  Administered 2013-01-06 – 2013-01-14 (×15): 25 mg via ORAL
  Filled 2013-01-06: qty 20

## 2013-01-06 MED ORDER — DOCUSATE SODIUM 100 MG PO CAPS
100.0000 mg | ORAL_CAPSULE | Freq: Once | ORAL | Status: AC
  Administered 2013-01-06: 100 mg via ORAL
  Filled 2013-01-06: qty 1

## 2013-01-06 MED ORDER — NICOTINE 14 MG/24HR TD PT24
14.0000 mg | MEDICATED_PATCH | Freq: Every day | TRANSDERMAL | Status: DC
  Filled 2013-01-06 (×4): qty 1

## 2013-01-06 MED ORDER — ACETAMINOPHEN 325 MG PO TABS
650.0000 mg | ORAL_TABLET | Freq: Four times a day (QID) | ORAL | Status: DC | PRN
  Administered 2013-01-11 – 2013-01-13 (×4): 650 mg via ORAL

## 2013-01-06 MED ORDER — QUETIAPINE FUMARATE 100 MG PO TABS
100.0000 mg | ORAL_TABLET | Freq: Once | ORAL | Status: AC
  Administered 2013-01-06: 100 mg via ORAL
  Filled 2013-01-06: qty 1

## 2013-01-06 MED ORDER — ALUM & MAG HYDROXIDE-SIMETH 200-200-20 MG/5ML PO SUSP
30.0000 mL | ORAL | Status: DC | PRN
  Administered 2013-01-12 – 2013-01-13 (×3): 30 mL via ORAL

## 2013-01-06 MED ORDER — BUPROPION HCL ER (SR) 150 MG PO TB12: 150.0000 mg | ORAL_TABLET | Freq: Every day | ORAL | Status: DC

## 2013-01-06 MED ORDER — BUPROPION HCL ER (SR) 150 MG PO TB12
150.0000 mg | ORAL_TABLET | Freq: Two times a day (BID) | ORAL | Status: DC
  Administered 2013-01-06 – 2013-01-07 (×2): 150 mg via ORAL
  Filled 2013-01-06 (×4): qty 1

## 2013-01-06 MED ORDER — DULOXETINE HCL 30 MG PO CPEP
30.0000 mg | ORAL_CAPSULE | Freq: Every day | ORAL | Status: DC
  Administered 2013-01-07 – 2013-01-15 (×9): 30 mg via ORAL
  Filled 2013-01-06 (×11): qty 1

## 2013-01-06 MED ORDER — MAGNESIUM HYDROXIDE 400 MG/5ML PO SUSP: 30.0000 mL | Freq: Every day | ORAL | Status: DC | PRN

## 2013-01-06 NOTE — ED Notes (Signed)
Patient will be discharge to [facility] with security and NT
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
## 2013-01-06 NOTE — ED Provider Notes (Signed)
Patient without complaints.  She is pending admission to behavioral health when a bed is ready.  She is voluntary.   Hilario Quarry, MD 01/06/13 [phone]
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
## 2013-01-06 NOTE — Accreditation Note (Signed)
Patient accepted to [facility] 01/06/2013 by psychiatrist-Dr. Elsie Saas. The attending physician is Dr. Geoffery Lyons. Patients room # is 307-1. Writer notified the EDP-Dr. Juleen China and patients nurse-Eric. The nurse to nurse report # is [phone]. All support paperwork complete and faxed to [facility]. Patient is here voluntary and will be transported via sheriff.

## 2013-01-06 NOTE — Tx Team (Signed)
Initial Interdisciplinary Treatment Plan  PATIENT STRENGTHS: (choose at least two) Average or above average intelligence Communication skills Supportive family/friends  PATIENT STRESSORS: Financial difficulties Marital or family conflict   PROBLEM LIST: Problem List/Patient Goals Date to be addressed Date deferred Reason deferred Estimated date of resolution  Anxiety 01/06/13     Depression 01/06/13     Suicide Risk 01/06/13                                          DISCHARGE CRITERIA:  Verbal commitment to aftercare and medication compliance  PRELIMINARY DISCHARGE PLAN: Outpatient therapy  PATIENT/FAMIILY INVOLVEMENT: This treatment plan has been presented to and reviewed with the patient, Loretta Hood, and/or family member, .  The patient and family have been given the opportunity to ask questions and make suggestions.  [name] 01/06/2013, 9:04 PM

## 2013-01-06 NOTE — ED Notes (Signed)
Notified Dr. Rosalia Hammers of pts request for something for Anxiety and constipation

## 2013-01-07 ENCOUNTER — Encounter (HOSPITAL_COMMUNITY): Payer: Self-pay | Admitting: Psychiatry

## 2013-01-07 DIAGNOSIS — F4310 Post-traumatic stress disorder, unspecified: Secondary | ICD-10-CM | POA: Diagnosis present

## 2013-01-07 DIAGNOSIS — F329 Major depressive disorder, single episode, unspecified: Secondary | ICD-10-CM | POA: Diagnosis present

## 2013-01-07 DIAGNOSIS — F419 Anxiety disorder, unspecified: Secondary | ICD-10-CM | POA: Diagnosis present

## 2013-01-07 LAB — URINALYSIS, ROUTINE W REFLEX MICROSCOPIC
Hgb urine dipstick: NEGATIVE
Nitrite: POSITIVE — AB
Protein, ur: NEGATIVE mg/dL
Specific Gravity, Urine: 1.03 (ref 1.005–1.030)
Urobilinogen, UA: 0.2 mg/dL (ref 0.0–1.0)

## 2013-01-07 LAB — TSH: TSH: 1.604 u[IU]/mL (ref 0.350–4.500)

## 2013-01-07 LAB — URINE MICROSCOPIC-ADD ON

## 2013-01-07 MED ORDER — CIPROFLOXACIN HCL 500 MG PO TABS
500.0000 mg | ORAL_TABLET | Freq: Two times a day (BID) | ORAL | Status: AC
  Administered 2013-01-07 – 2013-01-10 (×6): 500 mg via ORAL
  Filled 2013-01-07 (×8): qty 1

## 2013-01-07 MED ORDER — BUPROPION HCL ER (SR) 150 MG PO TB12
150.0000 mg | ORAL_TABLET | Freq: Every day | ORAL | Status: DC
  Administered 2013-01-08 – 2013-01-13 (×6): 150 mg via ORAL
  Filled 2013-01-07 (×8): qty 1

## 2013-01-07 MED ORDER — RISPERIDONE 0.5 MG PO TABS
0.5000 mg | ORAL_TABLET | Freq: Two times a day (BID) | ORAL | Status: DC
  Administered 2013-01-07 – 2013-01-09 (×4): 0.5 mg via ORAL
  Filled 2013-01-07 (×8): qty 1
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
## 2013-01-07 NOTE — Progress Notes (Signed)
D: Pt. Lying in bed. A: Writer introduced self to pt. And reviewed meds to be administered. R: Pt. came to med window to take meds. (See MAR).

## 2013-01-07 NOTE — H&P (Signed)
Reviewed. Patient seen and assessed. Endorsing depressed mood, active suicidal thoughts, increased agitation. Able to contract for safety. Will adjust medications as needed.
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
## 2013-01-07 NOTE — Progress Notes (Signed)
[facility] LCSW Aftercare Discharge Planning Group Note  01/07/2013 4:14 PM  Participation Quality: Patient did not attend.  [name] 01/07/2013, 4:14 PM
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
## 2013-01-07 NOTE — Progress Notes (Signed)
Psychoeducational Group Note  Date:  01/07/2013 Time: 1100  Group Topic/Focus:  Rediscovering Joy:   The focus of this group is to explore various ways to relieve stress in a positive manner.  Participation Level: Did Not Attend  Participation Quality:  Not Applicable  Affect:  Not Applicable  Cognitive:  Not Applicable  Insight:  Not Applicable  Engagement in Group: Not Applicable  Additional Comments:  Patient did not attend group, patient remained in bed.  Karleen Hampshire Brittini 01/07/2013, 6:35 PM
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
## 2013-01-08 DIAGNOSIS — F411 Generalized anxiety disorder: Secondary | ICD-10-CM

## 2013-01-08 DIAGNOSIS — F3130 Bipolar disorder, current episode depressed, mild or moderate severity, unspecified: Secondary | ICD-10-CM

## 2013-01-08 MED ORDER — TRAZODONE HCL 100 MG PO TABS
100.0000 mg | ORAL_TABLET | Freq: Every day | ORAL | Status: DC
  Administered 2013-01-08 – 2013-01-14 (×7): 100 mg via ORAL
  Filled 2013-01-08 (×6): qty 1
  Filled 2013-01-08: qty 14
  Filled 2013-01-08 (×2): qty 1

## 2013-01-08 NOTE — Progress Notes (Signed)
D) Pt continues to have thoughts of SI constantly today. Rates her depression at a 7 and her hopelessness at a 9. Was hard getting Pt up today to get her medications and to attend the groups this morning. After being up for awhile Pt was more willing to attend the groups.  A) Contracting with Pt. For her safety on the unit. Given support and reassurance along with appropriate praise. R) Contracting for her safety on the unit.

## 2013-01-08 NOTE — Progress Notes (Signed)
[facility] Group Notes:  (Nursing/MHT/Case Management/Adjunct)  Date:  01/08/2013  Time:  7:01 PM  Type of Therapy:  Therapeutic Activity-recovery goals  Participation Level:  Active  Participation Quality:  Appropriate and Attentive  Affect:  Appropriate  Cognitive:  Appropriate  Insight:  Appropriate  Engagement in Group:  Developing/Improving and Engaged  Modes of Intervention:  Activity, Discussion, Education, Socialization and Support  Summary of Progress/Problems: Shirla attended and participated in therapeutic activity on recovery goals. Patient stated one goal on recovery for to have when discharged, once written another group member would read the goal and the patient would explain why they chose that goal. Patient was guarded when sharing due to the resistant with sharing in a group setting.    Karleen Hampshire Brittini 01/08/2013, 7:01 PM

## 2013-01-08 NOTE — Progress Notes (Signed)
Pt in bed talking with roommate. Writer introduced self to pt.Pt reports that her vistaril given at bedtime was effective. Pt is denying having any concerns at this time. Pt remains safe with q46min checks.

## 2013-01-08 NOTE — Progress Notes (Signed)
[facility] LCSW Aftercare Discharge Planning Group Note  01/08/2013 2:44 PM  Participation Quality: Patient did not attend.  [name] 01/08/2013, 2:44 PM
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
## 2013-01-08 NOTE — Progress Notes (Signed)
[facility] INPATIENT:  Family/Significant Other Suicide Prevention Education  Suicide Prevention Education:  Education Completed; Norton Blizzard, Husband andMarty Rudsill, Mother, presented in lobby,  has been identified by the patient as the family member/significant other with whom the patient will be residing, and identified as the person(s) who will aid the patient in the event of a mental health crisis (suicidal ideations/suicide attempt).  With written consent from the patient, the family member/significant other has been provided the following suicide prevention education, prior to the and/or following the discharge of the patient.  The suicide prevention education provided includes the following:  Suicide risk factors  Suicide prevention and interventions  National Suicide Hotline telephone number  [facility] assessment telephone number  [facility] Emergency Assistance 911  [facility] and/or Residential Mobile Crisis Unit telephone number  Request made of family/significant other to:  Remove weapons (e.g., guns, rifles, knives), all items previously/currently identified as safety concern.  Husband reports there are no guns in the home.  Remove drugs/medications (over-the-counter, prescriptions, illicit drugs), all items previously/currently identified as a safety concern.  The family member/significant other verbalizes understanding of the suicide prevention education information provided.  The family member/significant other agrees to remove the items of safety concern listed above.  [name] 01/08/2013, 2:47 PM

## 2013-01-08 NOTE — Progress Notes (Signed)
[facility] LCSW Group Therapy       Feelings Around Relapse        1:15-2:30 PM   01/08/2013 2:44 PM  Type of Therapy:  Group Therapy  Participation Level: Active  Participation Quality:  Appropariate  Affect:  Appropriate and Depressed  Cognitive:  Appropriate  Insight:Engaged  Engagement in Therapy:  Engaged  Modes of Intervention:  Discussion, Education, Exploration, Problem-solving, Rapport Building and Support  Summary of Progress/Problems:  Patient shared she relapses when she is disappointed or put too many things on her plate.   She shared she has to learn to accept that she will not get to spend time with her children, who do not live with her, every times she plans to do so.   [name] 01/08/2013, 2:44 PM

## 2013-01-08 NOTE — Tx Team (Signed)
Interdisciplinary Treatment Plan Update (Adult)  Date:  01/08/2013  Time Reviewed:  9:50 AM   Progress in Treatment: Attending groups:   Yes   Participating in groups:  Yes Taking medication as prescribed:  Yes Tolerating medication:  Yes Family/Significant othe contact made: Contact to be made with family Patient understands diagnosis:  Yes Discussing patient identified problems/goals with staff: Yes Medical problems stabilized or resolved: Yes Denies suicidal/homicidal ideation: No, but able to contract for safety Issues/concerns per patient self-inventory:  Other:   New problem(s) identified:  Reason for Continuation of Hospitalization: Anxiety Depression Medication stabilization Suicidal ideation  Interventions implemented related to continuation of hospitalization:  Medication Management; safety checks q 15 mins  Additional comments:  Estimated length of stay:  2-3 days  Discharge Plan:  Home with outpatient follow up  New goal(s):  Review of initial/current patient goals per problem list:    1.  Goal(s): Eliminate SI/other thoughts of self harm (Patient will no longer endorse SI/HI or thoughts of self harm)   Met:  No  Target date: d/c  As evidenced by: Patient continues to endorse SI but contracts    2.  Goal (s):Reduce depression/anxiety (Paitent will rate symptoms at four or below)  Met: No   Target date: d/c  As evidenced by: Patient did not rate symptoms today    3.  Goal(s):.stabilize on meds (Patient will report being stabilized on medications - less symtomatic   Met:  Yes  Target date: d/c  As evidenced by: Patient stabilizing on medications started on 01/07/13    4.  Goal(s): Refer for outpatient follow up (Follow up appointment will be scheduled)   Met:  No  Target date: d/c  As evidenced by: Follow up appointment to be scheduled    Attendees: Patient:   01/08/2013 9:50 AM  Physican:  Patrick North, MD 01/08/2013 9:50 AM   Nursing:    Jacqlyn Larsen, RN 01/08/2013 9:50 AM   Nursing:    Harold Barban, RN 01/08/2013 9:50 AM   Clinical Social Worker:  Juline Patch, LCSW 01/08/2013 9:50 AM   Other:   Gweneth Dimitri, MT 01/08/2013 9:50 AM   Other:  Vikki Ports, LCP Intern    01/08/2013 9:50 AM Other:        01/08/2013 9:50 AM

## 2013-01-08 NOTE — Progress Notes (Signed)
[facility] MD Progress Note  01/08/2013 1:30 PM Loretta Hood  MRN:  [id number] Subjective:  7/10 depression, 5/10 anxiety--improved, stuttering gone, vistaril did not work for her for sleep, trazodone for sleep ordered, complains of feeling really tired during the day, difficulty sleeping at night, denies physical issues Diagnosis:   Axis I: Anxiety Disorder NOS, Bipolar, Depressed and Depressive Disorder NOS Axis II: Deferred Axis III:  Past Medical History  Diagnosis Date  . Anxiety   . Depression   . Alcoholic   . Drug addiction in remission   . Bipolar 1 disorder    Axis IV: other psychosocial or environmental problems, problems related to social environment and problems with primary support group Axis V: 41-50 serious symptoms  ADL's:  Intact  Sleep: Poor  Appetite:  Fair  Suicidal Ideation:  Denies Homicidal Ideation:  Denies  Psychiatric Specialty Exam: Review of Systems  Constitutional: Negative.   HENT: Negative.   Eyes: Negative.   Respiratory: Negative.   Cardiovascular: Negative.   Gastrointestinal: Negative.   Genitourinary: Negative.   Musculoskeletal: Negative.   Skin: Negative.   Neurological: Negative.   Endo/Heme/Allergies: Negative.   Psychiatric/Behavioral: Positive for depression. The patient is nervous/anxious.     Blood pressure 109/70, pulse 86, temperature 97.8 F (36.6 C), temperature source Oral, resp. rate 20, height 5' 3.5" (1.613 m), weight 77.111 kg (170 lb), last menstrual period 12/05/2012, SpO2 98.00%.Body mass index is 29.64 kg/(m^2).  General Appearance: Casual  Eye Contact::  Fair  Speech:  Normal Rate  Volume:  Normal  Mood:  Anxious and Depressed  Affect:  Depressed  Thought Process:  Coherent  Orientation:  Full (Time, Place, and Person)  Thought Content:  WDL  Suicidal Thoughts:  No  Homicidal Thoughts:  No  Memory:  Immediate;   Fair Recent;   Fair Remote;   Fair  Judgement:  Fair  Insight:  Fair  Psychomotor  Activity:  Normal  Concentration:  Fair  Recall:  Fair  Akathisia:  No  Handed:  Right  AIMS (if indicated):     Assets:  Communication Skills Physical Health Resilience Social Support  Sleep:  Number of Hours: 6    Current Medications: Current Facility-Administered Medications  Medication Dose Route Frequency Provider Last Rate Last Dose  . acetaminophen (TYLENOL) tablet 650 mg  650 mg Oral Q6H PRN Kerry Hough, PA      . alum & mag hydroxide-simeth (MAALOX/MYLANTA) 200-200-20 MG/5ML suspension 30 mL  30 mL Oral Q4H PRN Kerry Hough, PA      . buPROPion [facility] SR) 12 hr tablet 150 mg  150 mg Oral Daily Himabindu Ravi, MD   150 mg at 01/08/13 0927  . ciprofloxacin (CIPRO) tablet 500 mg  500 mg Oral BID Verne Spurr, PA-C   500 mg at 01/08/13 2841  . DULoxetine (CYMBALTA) DR capsule 30 mg  30 mg Oral Daily Kerry Hough, PA   30 mg at 01/08/13 3244  . hydrOXYzine (ATARAX/VISTARIL) tablet 25 mg  25 mg Oral Q6H PRN Kerry Hough, PA   25 mg at 01/07/13 2205  . magnesium hydroxide (MILK OF MAGNESIA) suspension 30 mL  30 mL Oral Daily PRN Kerry Hough, PA      . nicotine (NICODERM CQ - dosed in mg/24 hours) patch 14 mg  14 mg Transdermal Q0600 Kerry Hough, PA      . risperiDONE (RISPERDAL) tablet 0.5 mg  0.5 mg Oral BID Himabindu Daleen Bo, MD   0.5  mg at 01/08/13 0927  . traZODone (DESYREL) tablet 100 mg  100 mg Oral QHS Nanine Means, NP        Lab Results:  Results for orders placed during the hospital encounter of 01/06/13 (from the past 48 hour(s))  TSH     Status: Normal   Collection Time   01/07/13  6:15 AM      Component Value Range Comment   TSH 1.604  0.350 - 4.500 uIU/mL   URINALYSIS, ROUTINE W REFLEX MICROSCOPIC     Status: Abnormal   Collection Time   01/07/13  6:43 PM      Component Value Range Comment   Color, Urine YELLOW  YELLOW    APPearance CLOUDY (*) CLEAR    Specific Gravity, Urine 1.030  1.005 - 1.030    pH 5.5  5.0 - 8.0    Glucose, UA NEGATIVE   NEGATIVE mg/dL    Hgb urine dipstick NEGATIVE  NEGATIVE    Bilirubin Urine NEGATIVE  NEGATIVE    Ketones, ur NEGATIVE  NEGATIVE mg/dL    Protein, ur NEGATIVE  NEGATIVE mg/dL    Urobilinogen, UA 0.2  0.0 - 1.0 mg/dL    Nitrite POSITIVE (*) NEGATIVE    Leukocytes, UA SMALL (*) NEGATIVE   URINE MICROSCOPIC-ADD ON     Status: Abnormal   Collection Time   01/07/13  6:43 PM      Component Value Range Comment   Squamous Epithelial / LPF FEW (*) RARE    WBC, UA 21-50  <3 WBC/hpf    Bacteria, UA MANY (*) RARE    Crystals CA OXALATE CRYSTALS (*) NEGATIVE     Physical Findings: AIMS: Facial and Oral Movements Muscles of Facial Expression: None, normal Lips and Perioral Area: None, normal Jaw: None, normal Tongue: None, normal,Extremity Movements Upper (arms, wrists, hands, fingers): None, normal Lower (legs, knees, ankles, toes): None, normal, Trunk Movements Neck, shoulders, hips: None, normal, Overall Severity Severity of abnormal movements (highest score from questions above): None, normal Incapacitation due to abnormal movements: None, normal Patient's awareness of abnormal movements (rate only patient's report): No Awareness, Dental Status Current problems with teeth and/or dentures?: No Does patient usually wear dentures?: No  CIWA:  CIWA-Ar Total: 0  COWS:  COWS Total Score: 0   Treatment Plan Summary: Daily contact with patient to assess and evaluate symptoms and progress in treatment Medication management  Plan:  Review of chart, vital signs, medications, and notes. 1-Individual and group therapy 2-Medication management for depression and anxiety:  Medications reviewed with the patient and Trazodone ordered for sleep, stuttering has stopped, patient states improvement in anxiety 3-Coping skills for depression and anxiety 4-Continue crisis stabilization and management 5-Address health issues--monitoring vital signs, stable 6-Treatment plan in progress to prevent relapse of  depression and anxiety  Medical Decision Making Problem Points:  Established problem, stable/improving (1) and Review of psycho-social stressors (1) Data Points:  Review of medication regiment & side effects (2)  I certify that inpatient services furnished can reasonably be expected to improve the patient's condition.   Nanine Means, PMH-NP 01/08/2013, 1:30 PM

## 2013-01-09 LAB — URINE CULTURE
Colony Count: NO GROWTH
Culture: NO GROWTH

## 2013-01-09 MED ORDER — RISPERIDONE 2 MG PO TBDP
2.0000 mg | ORAL_TABLET | Freq: Every day | ORAL | Status: DC
  Administered 2013-01-09 – 2013-01-14 (×6): 2 mg via ORAL
  Filled 2013-01-09 (×2): qty 1
  Filled 2013-01-09: qty 14
  Filled 2013-01-09 (×6): qty 1

## 2013-01-09 NOTE — Progress Notes (Signed)
Psychoeducational Group Note  Date: 01/09/2013 Time:  1015  Group Topic/Focus:  Identifying Needs:   The focus of this group is to help patients identify their personal needs that have been historically problematic and identify healthy behaviors to address their needs.  Participation Level:  Minimal  Participation Quality:  Drowsy  Affect:  Appropriate  Cognitive:  Appropriate  Insight:  Improving  Engagement in Group:  Improving and Limited  Additional Comments:    Dione Housekeeper

## 2013-01-09 NOTE — Clinical Social Work Note (Signed)
[facility] Group Notes:  (Clinical Social Work)  01/09/2013   3:00-4:00PM  Summary of Progress/Problems:   The main focus of today's process group was for the patient to identify ways in which they have in the past sabotaged their own recovery and to discuss their motivation to change, as well as confidence that they can change. Cognitive Behavioral Therapy concepts about the interconnectedness of thoughts/feelings/actions was introduced, along with 2 short techniques of Thought Stopping and Thought Replacement.  The patient expressed that she was here previously to get clean from drugs and alcohol.  She stated she has not used drugs since 2011, and has been sober from alcohol since September 2013 except for a relapse in November.  She complained that now the emotional pain is even worse than when she was drinking, displaying some insight that this is because that pain is no longer dulled.  She stated her motivation to change is 2 out of 10, and that some small part of her does not want to commit suicide.  However, she feels scared to come out of the depression, feeling that in some ways it would be easier to just stay this way.  She feels safe while in the hospital.  She displayed ability to recognize her feelings, and willingness to work on them.  She said that therapy often makes her feel worse, but that after this session she will be able to continue working with staff and her hospital friends so feels okay about it.  Type of Therapy:  Process Group, Cognitive Behavior Therapy, Motivational Interviewing  Participation Level:  Active  Participation Quality:  Attentive and Sharing  Affect:  Depressed and Flat  Cognitive:  Oriented  Insight:  Engaged  Engagement in Therapy:  Engaged  Modes of Intervention:  Clarification, Education, Limit-setting, Problem-solving, Socialization, Support and Processing, Exploration, Discussion   Ambrose Mantle, LCSW 01/09/2013, 5:44 PM

## 2013-01-09 NOTE — Progress Notes (Signed)
  GOALS GROUP  The focus of this group is to help patients establish daily goals to achieve during treatment and discuss how the patient can incorporate goal setting into their daily lives to aide in recovery.   Date: 01/09/2013 Time:  0900  Participation Level:  Active  Participation Quality:  Attentive  Affect:  Appropriate  Cognitive:  Appropriate  Insight:  Engaged  Engagement in Group:  Engaged  Additional Comments:    Arlo Butt A 

## 2013-01-09 NOTE — Progress Notes (Signed)
Patient ID: Loretta Hood, female   DOB: May 24, 1976, 37 y.o.   MRN: [id number] [facility] MD Progress Note  01/09/2013 1:56 PM Loretta Hood  MRN:  [id number] Subjective:  Feels sleepy today. Doesn't sleep well at night even at home. Thoughts increase and she sees things. Will switch Risperdal to HS. Diagnosis:   Axis I: Anxiety Disorder NOS, Bipolar, Depressed and Depressive Disorder NOS Axis II: Deferred Axis III:  Past Medical History  Diagnosis Date  . Anxiety   . Depression   . Alcoholic   . Drug addiction in remission   . Bipolar 1 disorder    Axis IV: other psychosocial or environmental problems, problems related to social environment and problems with primary support group Axis V: 41-50 serious symptoms  ADL's:  Intact  Sleep: Poor  Appetite:  Fair  Suicidal Ideation:  Denies Homicidal Ideation:  Denies  Psychiatric Specialty Exam: Review of Systems  Constitutional: Negative.   HENT: Negative.   Eyes: Negative.   Respiratory: Negative.   Cardiovascular: Negative.   Gastrointestinal: Negative.   Genitourinary: Negative.   Musculoskeletal: Negative.   Skin: Negative.   Neurological: Negative.   Endo/Heme/Allergies: Negative.   Psychiatric/Behavioral: Positive for depression. The patient is nervous/anxious.     Blood pressure 85/57, pulse 113, temperature 98.3 F (36.8 C), temperature source Oral, resp. rate 18, height 5' 3.5" (1.613 m), weight 77.111 kg (170 lb), last menstrual period 12/05/2012, SpO2 98.00%.Body mass index is 29.64 kg/(m^2).  General Appearance: Casual  Eye Contact::  Fair  Speech:  Normal Rate  Volume:  Normal  Mood:  Anxious and Depressed  Affect:  Depressed  Thought Process:  Coherent  Orientation:  Full (Time, Place, and Person)  Thought Content:  WDL  Suicidal Thoughts:  No  Homicidal Thoughts:  No  Memory:  Immediate;   Fair Recent;   Fair Remote;   Fair  Judgement:  Fair  Insight:  Fair  Psychomotor Activity:  Normal   Concentration:  Fair  Recall:  Fair  Akathisia:  No  Handed:  Right  AIMS (if indicated):     Assets:  Communication Skills Physical Health Resilience Social Support  Sleep:  Number of Hours: 5.75   Current Medications: Current Facility-Administered Medications  Medication Dose Route Frequency Provider Last Rate Last Dose  . acetaminophen (TYLENOL) tablet 650 mg  650 mg Oral Q6H PRN Kerry Hough, PA      . alum & mag hydroxide-simeth (MAALOX/MYLANTA) 200-200-20 MG/5ML suspension 30 mL  30 mL Oral Q4H PRN Kerry Hough, PA      . buPROPion [facility] SR) 12 hr tablet 150 mg  150 mg Oral Daily Himabindu Ravi, MD   150 mg at 01/09/13 0908  . ciprofloxacin (CIPRO) tablet 500 mg  500 mg Oral BID Verne Spurr, PA-C   500 mg at 01/09/13 0908  . DULoxetine (CYMBALTA) DR capsule 30 mg  30 mg Oral Daily Kerry Hough, PA   30 mg at 01/09/13 5621  . hydrOXYzine (ATARAX/VISTARIL) tablet 25 mg  25 mg Oral Q6H PRN Kerry Hough, PA   25 mg at 01/08/13 2222  . magnesium hydroxide (MILK OF MAGNESIA) suspension 30 mL  30 mL Oral Daily PRN Kerry Hough, PA      . risperiDONE (RISPERDAL) tablet 0.5 mg  0.5 mg Oral BID Himabindu Ravi, MD   0.5 mg at 01/09/13 0907  . traZODone (DESYREL) tablet 100 mg  100 mg Oral QHS Nanine Means, NP   100  mg at 01/08/13 2202    Lab Results:  Results for orders placed during the hospital encounter of 01/06/13 (from the past 48 hour(s))  URINALYSIS, ROUTINE W REFLEX MICROSCOPIC     Status: Abnormal   Collection Time    01/07/13  6:43 PM      Result Value Range   Color, Urine YELLOW  YELLOW   APPearance CLOUDY (*) CLEAR   Specific Gravity, Urine 1.030  1.005 - 1.030   pH 5.5  5.0 - 8.0   Glucose, UA NEGATIVE  NEGATIVE mg/dL   Hgb urine dipstick NEGATIVE  NEGATIVE   Bilirubin Urine NEGATIVE  NEGATIVE   Ketones, ur NEGATIVE  NEGATIVE mg/dL   Protein, ur NEGATIVE  NEGATIVE mg/dL   Urobilinogen, UA 0.2  0.0 - 1.0 mg/dL   Nitrite POSITIVE (*) NEGATIVE    Leukocytes, UA SMALL (*) NEGATIVE  URINE MICROSCOPIC-ADD ON     Status: Abnormal   Collection Time    01/07/13  6:43 PM      Result Value Range   Squamous Epithelial / LPF FEW (*) RARE   WBC, UA 21-50  <3 WBC/hpf   Bacteria, UA MANY (*) RARE   Crystals CA OXALATE CRYSTALS (*) NEGATIVE  URINE CULTURE     Status: None   Collection Time    01/07/13  6:43 PM      Result Value Range   Specimen Description URINE, CLEAN CATCH     Special Requests NONE     Culture  Setup Time 01/08/2013 06:00     Colony Count NO GROWTH     Culture NO GROWTH     Report Status 01/09/2013 FINAL      Physical Findings: AIMS: Facial and Oral Movements Muscles of Facial Expression: None, normal Lips and Perioral Area: None, normal Jaw: None, normal Tongue: None, normal,Extremity Movements Upper (arms, wrists, hands, fingers): None, normal Lower (legs, knees, ankles, toes): None, normal, Trunk Movements Neck, shoulders, hips: None, normal, Overall Severity Severity of abnormal movements (highest score from questions above): None, normal Incapacitation due to abnormal movements: None, normal Patient's awareness of abnormal movements (rate only patient's report): No Awareness, Dental Status Current problems with teeth and/or dentures?: No Does patient usually wear dentures?: No  CIWA:  CIWA-Ar Total: 0 COWS:  COWS Total Score: 0  Treatment Plan Summary: Daily contact with patient to assess and evaluate symptoms and progress in treatment Medication management  Plan:  Review of chart, vital signs, medications, and notes. 1-Individual and group therapy 2-Medication management for depression and anxiety:  Medications reviewed with the patient and Trazodone ordered for sleep, stuttering has stopped, patient states improvement in anxiety 3-Coping skills for depression and anxiety 4-Continue crisis stabilization and management 5-Address health issues--monitoring vital signs, stable 6-Treatment plan in  progress to prevent relapse of depression and anxiety  Medical Decision Making Problem Points:  Established problem, stable/improving (1) and Review of psycho-social stressors (1) Data Points:  Review of medication regiment & side effects (2)  I certify that inpatient services furnished can reasonably be expected to improve the patient's condition.   Phillipa Morden,MICKIE D. RPA-C CAQ-Psych  01/09/2013, 1:56 PM

## 2013-01-10 NOTE — Clinical Social Work Note (Signed)
[facility] Group Notes: (Clinical Social Work)   01/10/2013      Type of Therapy:  Group Therapy   Participation Level:  Did Not Attend    Ambrose Mantle, LCSW 01/10/2013, 5:08 PM
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
## 2013-01-10 NOTE — Progress Notes (Signed)
Psychoeducational Group Note  Date:  01/10/2013 Time:  1015  Group Topic/Focus:  Making Healthy Choices:   The focus of this group is to help patients identify negative/unhealthy choices they were using prior to admission and identify positive/healthier coping strategies to replace them upon discharge.  Participation Level:  Active  Participation Quality:  Appropriate  Affect:  Appropriate  Cognitive:  Appropriate  Insight:  Improving  Engagement in Group:  Improving  Additional Comments:    Eugene Zeiders A 01/10/2013 

## 2013-01-10 NOTE — Progress Notes (Signed)
Psychoeducational Group Note  Date:  01/10/2013 Time:  1015  Group Topic/Focus:  Making Healthy Choices:   The focus of this group is to help patients identify negative/unhealthy choices they were using prior to admission and identify positive/healthier coping strategies to replace them upon discharge.  Participation Level:  Active  Participation Quality:  Appropriate  Affect:  Appropriate  Cognitive:  Appropriate  Insight:  Improving  Engagement in Group:  Improving  Additional Comments:    Tiersa Dayley A 01/10/2013 

## 2013-01-10 NOTE — Progress Notes (Signed)
Patient ID: Loretta Hood, female   DOB: [birth year], 37 y.o.   MRN: [id number] [facility] MD Progress Note  01/10/2013 11:08 AM TAITE BALDASSARI  MRN:  [id number] Subjective:  Dreams and indigestion woke her up. Overall she slept better.No med changes today.   Diagnosis:   Axis I: Anxiety Disorder NOS, Bipolar, Depressed and Depressive Disorder NOS Axis II: Deferred Axis III:  Past Medical History  Diagnosis Date  . Anxiety   . Depression   . Alcoholic   . Drug addiction in remission   . Bipolar 1 disorder    Axis IV: other psychosocial or environmental problems, problems related to social environment and problems with primary support group Axis V: 41-50 serious symptoms  ADL's:  Intact  Sleep: Poor  Appetite:  Fair  Suicidal Ideation:  Denies Homicidal Ideation:  Denies  Psychiatric Specialty Exam: Review of Systems  Constitutional: Negative.   HENT: Negative.   Eyes: Negative.   Respiratory: Negative.   Cardiovascular: Negative.   Gastrointestinal: Negative.   Genitourinary: Negative.   Musculoskeletal: Negative.   Skin: Negative.   Neurological: Negative.   Endo/Heme/Allergies: Negative.   Psychiatric/Behavioral: Positive for depression. The patient is nervous/anxious.     Blood pressure 91/59, pulse 92, temperature 98.7 F (37.1 C), temperature source Oral, resp. rate 16, height 5' 3.5" (1.613 m), weight 77.111 kg (170 lb), last menstrual period 12/05/2012, SpO2 98.00%.Body mass index is 29.64 kg/(m^2).  General Appearance: Casual  Eye Contact::  Fair  Speech:  Normal Rate  Volume:  Normal  Mood:  Anxious and Depressed  Affect:  Depressed  Thought Process:  Coherent  Orientation:  Full (Time, Place, and Person)  Thought Content:  WDL  Suicidal Thoughts:  No  Homicidal Thoughts:  No  Memory:  Immediate;   Fair Recent;   Fair Remote;   Fair  Judgement:  Fair  Insight:  Fair  Psychomotor Activity:  Normal  Concentration:  Fair  Recall:  Fair  Akathisia:   No  Handed:  Right  AIMS (if indicated):     Assets:  Communication Skills Physical Health Resilience Social Support  Sleep:  Number of Hours: 6.75   Current Medications: Current Facility-Administered Medications  Medication Dose Route Frequency Provider Last Rate Last Dose  . acetaminophen (TYLENOL) tablet 650 mg  650 mg Oral Q6H PRN Kerry Hough, PA      . alum & mag hydroxide-simeth (MAALOX/MYLANTA) 200-200-20 MG/5ML suspension 30 mL  30 mL Oral Q4H PRN Kerry Hough, PA      . buPROPion [facility] SR) 12 hr tablet 150 mg  150 mg Oral Daily Himabindu Ravi, MD   150 mg at 01/10/13 0852  . DULoxetine (CYMBALTA) DR capsule 30 mg  30 mg Oral Daily Kerry Hough, PA   30 mg at 01/10/13 4132  . hydrOXYzine (ATARAX/VISTARIL) tablet 25 mg  25 mg Oral Q6H PRN Kerry Hough, PA   25 mg at 01/09/13 1721  . magnesium hydroxide (MILK OF MAGNESIA) suspension 30 mL  30 mL Oral Daily PRN Kerry Hough, PA      . risperiDONE (RISPERDAL M-TABS) disintegrating tablet 2 mg  2 mg Oral QHS Fredrik Cove, PA-C   2 mg at 01/09/13 2119  . traZODone (DESYREL) tablet 100 mg  100 mg Oral QHS Nanine Means, NP   100 mg at 01/09/13 2119    Lab Results:  No results found for this or any previous visit (from the past 48 hour(s)).  Physical  Findings: AIMS: Facial and Oral Movements Muscles of Facial Expression: None, normal Lips and Perioral Area: None, normal Jaw: None, normal Tongue: None, normal,Extremity Movements Upper (arms, wrists, hands, fingers): None, normal Lower (legs, knees, ankles, toes): None, normal, Trunk Movements Neck, shoulders, hips: None, normal, Overall Severity Severity of abnormal movements (highest score from questions above): None, normal Incapacitation due to abnormal movements: None, normal Patient's awareness of abnormal movements (rate only patient's report): No Awareness, Dental Status Current problems with teeth and/or dentures?: No Does patient usually wear  dentures?: No  CIWA:  CIWA-Ar Total: 0 COWS:  COWS Total Score: 0  Treatment Plan Summary: Daily contact with patient to assess and evaluate symptoms and progress in treatment Medication management  Plan:  Review of chart, vital signs, medications, and notes. 1-Individual and group therapy 2-Medication management for depression and anxiety:  Medications reviewed with the patient and Trazodone ordered for sleep, stuttering has stopped, patient states improvement in anxiety 3-Coping skills for depression and anxiety 4-Continue crisis stabilization and management 5-Address health issues--monitoring vital signs, stable 6-Treatment plan in progress to prevent relapse of depression and anxiety  Medical Decision Making Problem Points:  Established problem, stable/improving (1) and Review of psycho-social stressors (1) Data Points:  Review of medication regiment & side effects (2)  I certify that inpatient services furnished can reasonably be expected to improve the patient's condition.   Laure Leone,MICKIE D. RPA-C CAQ-Psych  01/10/2013, 11:08 AM
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
## 2013-01-10 NOTE — Progress Notes (Addendum)
D) Pt rates her depression and hopelessness both at an 8. Admits to thoughts of SI on and off. Pt has a hard time getting up in the morning and has to be encouraged. Affect is flat and mood depressed. Tends to withdrawn and go back to sleep instead of helping herself and attending the groups. Even with coaxing, Pt will stay in her room. A) Pt given encouragement to get out of bed attend the groups.  R) Pt has remained in the bed and has onlhy gotten up for meals. Has not attended groups.

## 2013-01-11 DIAGNOSIS — F1010 Alcohol abuse, uncomplicated: Secondary | ICD-10-CM
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
## 2013-01-11 NOTE — Progress Notes (Signed)
Psychoeducational Group Note  Date:  01/11/2013 Time:  1100  Group Topic/Focus:  Self Care:   The focus of this group is to help patients understand the importance of self-care in order to improve or restore emotional, physical, spiritual, interpersonal, and financial health.  Participation Level: Did Not Attend  Participation Quality:  Not Applicable  Affect:  Not Applicable  Cognitive:  Not Applicable  Insight:  Not Applicable  Engagement in Group: Not Applicable  Additional Comments:  Pt remained lying in bed during this hr.  Sharyn Lull 01/11/2013, 3:26 PM
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
## 2013-01-11 NOTE — Progress Notes (Signed)
Recreation Therapy Notes  Date: 02.10.2014 Time: 3:00pm      Group Topic/Focus: AAA  Participation Level: Active  Participation Quality: Appropriate  Affect: Bright  Cognitive: Appropriate   Additional Comments: Patient pet and visited with [place] the dog. Patient stated she was happy to see the dog team.   Jearl Klinefelter, LRT/CTRS

## 2013-01-11 NOTE — Clinical Social Work Note (Signed)
[facility] LCSW Aftercare Discharge Planning Group Note  01/11/2013 8:45 AM  Participation Quality:  Appropriate and Attentive  Affect:  Appropriate, Depressed and Flat  Cognitive:  Alert and Appropriate  Insight:  Developing/Improving  Engagement in Group:  Developing/Improving  Modes of Intervention:  Clarification, Discussion, Education, Exploration, Orientation, Problem-solving, Rapport Building, Socialization and Support  Summary of Progress/Problems: Pt attended discharge planning group and actively participated in group.  CSW provided pt with today's workbook.  Pt rates depression and anxiety at an 8 today.  Pt denies SI, stating not at this moment.  Pt states that she lives in [place] and will follow up with [facility].  No further needs voiced by pt at this time.    Reyes Ivan, LCSWA 01/11/2013 9:49 AM

## 2013-01-11 NOTE — Progress Notes (Signed)
[facility] Group Notes:  (Nursing/MHT/Case Management/Adjunct)  Date:  01/10/2013 Time:  2000  Type of Therapy:  Psychoeducational Skills  Participation Level:  Minimal  Participation Quality:  Attentive  Affect:  Blunted  Cognitive:  Appropriate  Insight:  Improving  Engagement in Group:  Engaged  Modes of Intervention:  Education  Summary of Progress/Problems: The patient stated that she had a good day , but would not offer very many details. She had a female visitor this evening. Her goal for tomorrow is to speak with the doctor about not being ready for discharge.   Chaitra Mast S 01/11/2013, 1:28 AM

## 2013-01-11 NOTE — Progress Notes (Signed)
[facility] Group Notes:  (Nursing/MHT/Case Management/Adjunct)  Date:  01/11/2013  Time:  10:04 PM  Type of Therapy:  Psychoeducational Skills  Participation Level:  Active  Participation Quality:  Appropriate  Affect:  Flat  Cognitive:  Alert  Insight:  Good  Engagement in Group:  Engaged  Modes of Intervention:  Exploration  Summary of Progress/Problems: Patient attended wrap-up group this evening. Patient reported her goal was to "not sleep so much during the day." Patient reports not sleeping well at night which affects her daytime activities. She stated "I might have missed one or two groups today. But I've made an effort to be up." Patient has been observed being very social on the unit. She visited with her boyfriend this evening who is a good support.   [name] ANN 01/11/2013, 10:04 PM

## 2013-01-11 NOTE — Progress Notes (Signed)
[facility] MD Progress Note  01/11/2013 12:08 PM Loretta Hood  MRN:  [id number] Subjective:  Patient lying in bed. Continues to feel depressed with suicidal thoughts. Tolerating her medications well, denies side effects.  Diagnosis:   Axis I: Alcohol Abuse and Depressive Disorder NOS Axis II: Deferred Axis III:  Past Medical History  Diagnosis Date  . Anxiety   . Depression   . Alcoholic   . Drug addiction in remission   . Bipolar 1 disorder    Axis IV: other psychosocial or environmental problems Axis V: 41-50 serious symptoms  ADL's:  Intact  Sleep: Fair  Appetite:  Fair   Psychiatric Specialty Exam: Review of Systems  HENT: Negative.   Eyes: Negative.   Respiratory: Negative.   Cardiovascular: Negative.   Gastrointestinal: Negative.   Genitourinary: Negative.   Musculoskeletal: Negative.   Skin: Negative.   Neurological: Positive for weakness.  Endo/Heme/Allergies: Negative.   Psychiatric/Behavioral: Positive for depression and hallucinations. The patient is nervous/anxious.     Blood pressure 111/72, pulse 83, temperature 98 F (36.7 C), temperature source Oral, resp. rate 16, height 5' 3.5" (1.613 m), weight 77.111 kg (170 lb), last menstrual period 12/05/2012, SpO2 98.00%.Body mass index is 29.64 kg/(m^2).  General Appearance: Casual  Eye Contact::  Minimal  Speech:  Slow  Volume:  Decreased  Mood:  Depressed and Dysphoric  Affect:  Constricted and Flat  Thought Process:  Coherent  Orientation:  Full (Time, Place, and Person)  Thought Content:  Rumination  Suicidal Thoughts:  Yes.  without intent/plan  Homicidal Thoughts:  No  Memory:  Immediate;   Fair Recent;   Fair Remote;   Fair  Judgement:  Fair  Insight:  Present  Psychomotor Activity:  Decreased  Concentration:  Fair  Recall:  Poor  Akathisia:  No  Handed:  Right  AIMS (if indicated):     Assets:  Communication Skills Desire for Improvement Housing Social Support  Sleep:  Number of Hours:  5.75   Current Medications: Current Facility-Administered Medications  Medication Dose Route Frequency Provider Last Rate Last Dose  . acetaminophen (TYLENOL) tablet 650 mg  650 mg Oral Q6H PRN Kerry Hough, PA   650 mg at 01/11/13 4098  . alum & mag hydroxide-simeth (MAALOX/MYLANTA) 200-200-20 MG/5ML suspension 30 mL  30 mL Oral Q4H PRN Kerry Hough, PA      . buPROPion [facility] SR) 12 hr tablet 150 mg  150 mg Oral Daily Vaudine Dutan, MD   150 mg at 01/11/13 0852  . DULoxetine (CYMBALTA) DR capsule 30 mg  30 mg Oral Daily Kerry Hough, PA   30 mg at 01/11/13 1191  . hydrOXYzine (ATARAX/VISTARIL) tablet 25 mg  25 mg Oral Q6H PRN Kerry Hough, PA   25 mg at 01/11/13 0909  . magnesium hydroxide (MILK OF MAGNESIA) suspension 30 mL  30 mL Oral Daily PRN Kerry Hough, PA      . risperiDONE (RISPERDAL M-TABS) disintegrating tablet 2 mg  2 mg Oral QHS Fredrik Cove, PA-C   2 mg at 01/10/13 2115  . traZODone (DESYREL) tablet 100 mg  100 mg Oral QHS Nanine Means, NP   100 mg at 01/10/13 2115    Lab Results: No results found for this or any previous visit (from the past 48 hour(s)).  Physical Findings: AIMS: Facial and Oral Movements Muscles of Facial Expression: None, normal Lips and Perioral Area: None, normal Jaw: None, normal Tongue: None, normal,Extremity Movements Upper (arms, wrists,  hands, fingers): None, normal Lower (legs, knees, ankles, toes): None, normal, Trunk Movements Neck, shoulders, hips: None, normal, Overall Severity Severity of abnormal movements (highest score from questions above): None, normal Incapacitation due to abnormal movements: None, normal Patient's awareness of abnormal movements (rate only patient's report): No Awareness, Dental Status Current problems with teeth and/or dentures?: No Does patient usually wear dentures?: No  CIWA:  CIWA-Ar Total: 0 COWS:  COWS Total Score: 0  Treatment Plan Summary: Daily contact with patient to assess  and evaluate symptoms and progress in treatment Medication management  Plan: Continue current plan of care. Encourage patient to attend groups. Plan for discharge.  Medical Decision Making Problem Points:  Established problem, stable/improving (1), Review of last therapy session (1) and Review of psycho-social stressors (1) Data Points:  Review of medication regiment & side effects (2)  I certify that inpatient services furnished can reasonably be expected to improve the patient's condition.   Jackquline Branca 01/11/2013, 12:08 PM

## 2013-01-12 DIAGNOSIS — F329 Major depressive disorder, single episode, unspecified: Principal | ICD-10-CM

## 2013-01-12 NOTE — Progress Notes (Signed)
Grief and Loss Group  Group discussed losses in their life, including the loss of significant relationships, material losses, and the loss of self. Coping strategies were discussed.   Pt. Discussed the multiple losses in her life due to relationship choices and substance use. Pt. Discussed feeling out of control and the perception that ending her life could help her regain control over her life. Pt. Seemed hopeful toward the end of the session.   Sherol Dade Counselor Intern Haroldine Laws

## 2013-01-12 NOTE — Progress Notes (Signed)
Patient's fiancee called this morning. Norton Blizzard, phone [phone].  He wants to talk to MD about patient's discharge today (??) and what he and family can do to help her.  Patient told nurse this morning that she is not ready for discharge, that it "frightens her to think of leaving" [facility].  Nurse informed patient that her fiancee had called this morning.  D:  Patient's self inventory sheet, patient has poor sleep, poor appetite, low energy level, poor attention span.  Rated depression, hopelessness and anxiety #9.  Denied withdrawals.  SI off/on, contracts for safety.  Denied physical pain.  After discharge, plans to take medications, go to [facility].  "Have hard time sleeping, takes 4 hrs to go to sleep.   See people in room, old people look like shadows.  See blood on walls."  Denied voices.  Denied HI.  Does not feel ready for discharge, frightens her to think of leaving [facility]. A:  Medications administered per MD order.  Staff will monitor every 15 minutes for safety.  Emotional support and encouragement given patient.   R:  Patient remains safe on unit.   Denied HI.   Denied voices.  Does see people/shadows in her room at night, also sees blood on walls.  SI off/on, contracts for safety.

## 2013-01-12 NOTE — Progress Notes (Signed)
[facility] LCSW Group Therapy       Feelings About Diagnosis 1:15 - 2:30 PM          01/12/2013 3:54 PM  Type of Therapy:  Group Therapy  Participation Level:  Active  Participation Quality:  Appropriate and Attentive  Affect:  Appropriate, Depressed and Flat  Cognitive:  Alert and Appropriate  Insight:  Engaged  Engagement in Therapy:  Engaged  Modes of Intervention:  Discussion, Exploration, Problem-solving, Rapport Building and Support  Summary of Progress/Problems:  Patient shared people are so judgmental when they learn you have Bipolar Disorder.  She shared people expect her to be mean, angry and unpleasant.  She shared she is kind, loving and sometimes sad.  [name] 01/12/2013, 3:54 PM

## 2013-01-12 NOTE — Progress Notes (Signed)
Just before 2200, pt informed the hall tech that she was having an anxiety attack.  Pt was observed standing in the doorway of the dayroom breathing hard and rapidly, saying she needed her Vistaril as she was feeling very anxious.  Pt was given Vistaril 25 mg per order.  Pt took the med and went back to the dayroom.  Will continue to monitor.

## 2013-01-12 NOTE — Progress Notes (Signed)
Reviewed

## 2013-01-12 NOTE — Progress Notes (Signed)
[facility] MD Progress Note  01/12/2013 9:55 AM Loretta Hood  MRN:  [id number] Subjective:  Patient lying in bed, reports she has not been sleeping well. Continues to feel depressed, anxious and has suicidal thoughts. Has not been attending groups, she has isolated herself.  Diagnosis:   Axis I: Alcohol Abuse and Depressive Disorder NOS Axis II: Deferred Axis III:  Past Medical History  Diagnosis Date  . Anxiety   . Depression   . Alcoholic   . Drug addiction in remission   . Bipolar 1 disorder    Axis IV: other psychosocial or environmental problems Axis V: 41-50 serious symptoms  ADL's:  Intact  Sleep: Poor  Appetite:  Fair   Psychiatric Specialty Exam: Review of Systems  Constitutional: Negative.   HENT: Negative.   Eyes: Negative.   Respiratory: Negative.   Cardiovascular: Negative.   Gastrointestinal: Negative.   Genitourinary: Negative.   Musculoskeletal: Negative.   Skin: Negative.   Neurological: Negative.   Endo/Heme/Allergies: Negative.   Psychiatric/Behavioral: Positive for depression and suicidal ideas. The patient is nervous/anxious and has insomnia.     Blood pressure 101/67, pulse 79, temperature 98.2 F (36.8 C), temperature source Oral, resp. rate 16, height 5' 3.5" (1.613 m), weight 77.111 kg (170 lb), last menstrual period 12/05/2012, SpO2 98.00%.Body mass index is 29.64 kg/(m^2).  General Appearance: Casual  Eye Contact::  Minimal  Speech:  Slow  Volume:  Decreased  Mood:  Anxious and Depressed  Affect:  Constricted and Depressed  Thought Process:  Coherent  Orientation:  Full (Time, Place, and Person)  Thought Content:  Rumination  Suicidal Thoughts:  Yes.  without intent/plan  Homicidal Thoughts:  No  Memory:  Immediate;   Fair Recent;   Fair Remote;   Fair  Judgement:  Fair  Insight:  Shallow  Psychomotor Activity:  Decreased  Concentration:  Fair  Recall:  Fair  Akathisia:  No  Handed:  Right  AIMS (if indicated):     Assets:   Housing Social Support  Sleep:  Number of Hours: 5.25   Current Medications: Current Facility-Administered Medications  Medication Dose Route Frequency Provider Last Rate Last Dose  . acetaminophen (TYLENOL) tablet 650 mg  650 mg Oral Q6H PRN Kerry Hough, PA   650 mg at 01/11/13 4098  . alum & mag hydroxide-simeth (MAALOX/MYLANTA) 200-200-20 MG/5ML suspension 30 mL  30 mL Oral Q4H PRN Kerry Hough, PA      . buPROPion [facility] SR) 12 hr tablet 150 mg  150 mg Oral Daily Riann Oman, MD   150 mg at 01/12/13 0835  . DULoxetine (CYMBALTA) DR capsule 30 mg  30 mg Oral Daily Kerry Hough, PA   30 mg at 01/12/13 0834  . hydrOXYzine (ATARAX/VISTARIL) tablet 25 mg  25 mg Oral Q6H PRN Kerry Hough, PA   25 mg at 01/11/13 2233  . magnesium hydroxide (MILK OF MAGNESIA) suspension 30 mL  30 mL Oral Daily PRN Kerry Hough, PA      . risperiDONE (RISPERDAL M-TABS) disintegrating tablet 2 mg  2 mg Oral QHS Fredrik Cove, PA-C   2 mg at 01/11/13 2145  . traZODone (DESYREL) tablet 100 mg  100 mg Oral QHS Nanine Means, NP   100 mg at 01/11/13 2145    Lab Results: No results found for this or any previous visit (from the past 48 hour(s)).  Physical Findings: AIMS: Facial and Oral Movements Muscles of Facial Expression: None, normal Lips and Perioral  Area: None, normal Jaw: None, normal Tongue: None, normal,Extremity Movements Upper (arms, wrists, hands, fingers): None, normal Lower (legs, knees, ankles, toes): None, normal, Trunk Movements Neck, shoulders, hips: None, normal, Overall Severity Severity of abnormal movements (highest score from questions above): None, normal Incapacitation due to abnormal movements: None, normal Patient's awareness of abnormal movements (rate only patient's report): No Awareness, Dental Status Current problems with teeth and/or dentures?: No Does patient usually wear dentures?: No  CIWA:  CIWA-Ar Total: 2 COWS:  COWS Total Score: 1  Treatment  Plan Summary: Daily contact with patient to assess and evaluate symptoms and progress in treatment Medication management  Plan: Encourage patient to get out of bed and attend groups. Patient advised to not nap during daytime, this will help her to sleep better at night. Continue current plan of care, plan for discharge when stable. Medical Decision Making Problem Points:  Established problem, stable/improving (1), Review of last therapy session (1) and Review of psycho-social stressors (1) Data Points:  Review of medication regiment & side effects (2)  I certify that inpatient services furnished can reasonably be expected to improve the patient's condition.   Tanvir Hipple 01/12/2013, 9:55 AM

## 2013-01-12 NOTE — Progress Notes (Signed)
Pt reports she is doing ok this evening.  She had a visit from her fiance and mother tonight.  She has been observed in the dayroom socializing with her peers, esp a particular female patient.  She has been taking the Vistaril regularly for anxiety.  She says she is still having passive suicidal thoughts, but contracts for safety.  Pt makes her needs known to staff.  Support/encouragement given.  Safety maintained with q15 minute checks.

## 2013-01-12 NOTE — Progress Notes (Signed)
[facility] LCSW Aftercare Discharge Planning Group Note  01/12/2013 1:12 PM  Participation Quality:  Appropriate  Affect:  Appropriate  Cognitive:  Appropriate  Insight:  Engaged  Engagement in Group:  Engaged  Modes of Intervention:  Education, Exploration, Dentist, Rapport Building and Support  Summary of Progress/Problems:  Patient shared she is not doing well today.  She endorses off/on SI but able to contract for safety.  He rated all symptoms at nine.  Patient also endorses A/VH at night.   Patient stated she is having problems with legs and feet swelling. She advised she had talked with RN about the swelling.  Daily workbook provided.  [name] 01/12/2013, 1:12 PM

## 2013-01-12 NOTE — Progress Notes (Signed)
Adult Psychoeducational Group Note  Date:  01/12/2013 Time:  1:53 PM  Group Topic/Focus: Psychoeducational Group   Participation Level:  Active  Participation Quality:  Appropriate  Affect:  Appropriate  Cognitive:  Appropriate  Insight: Appropriate  Engagement in Group:  Engaged  Modes of Intervention:  Activity  Additional Comments:    Barton Fanny 01/12/2013, 1:53 PM

## 2013-01-13 MED ORDER — VENLAFAXINE HCL 25 MG PO TABS
25.0000 mg | ORAL_TABLET | Freq: Every day | ORAL | Status: DC
  Administered 2013-01-13 – 2013-01-14 (×2): 25 mg via ORAL
  Filled 2013-01-13 (×4): qty 1

## 2013-01-13 NOTE — Progress Notes (Signed)
[facility] LCSW Aftercare Discharge Planning Group Note  01/13/2013 10:38 AM  Participation Quality:  Appropriate  Affect:  Appropriate  Cognitive:  Appropriate  Insight:  Engaged  Engagement in Group:  Engaged  Modes of Intervention:  Education, Exploration, Dentist, Rapport Building and Support  Summary of Progress/Problems:  Patient shared she is not doing well today and had difficulty sleeping again last night.  She continues to endorse off/on SI but able to contract for safety. Patient is rating all symptoms at nine today.    [name] 01/13/2013, 10:38 AM

## 2013-01-13 NOTE — Progress Notes (Signed)
[facility] Group Notes:  (Counselor/Nursing/MHT/Case Management/Adjunct)  Type of Therapy:  Psychoeducational Skills  Participation Level:  Did Not Attend  Summary of Progress/Problems: Loretta Hood did not attend psychoeducational group that focused on using quality time with support systems/individuals to engage in health coping skills.   Wandra Scot 01/13/2013 3:22 PM

## 2013-01-13 NOTE — Progress Notes (Signed)
Pt reports she is still depressed/anxious about being discharged.  She asks for Vistaril as often as she can have it.  She has passive SI, but contracts for safety.  She denies HI/AV.  She states she is participating in unit activities.  Pt makes her needs known to staff.  Reports she does not sleep well at night, but review of 15 min check sheets show that pt has been sleeping between 5-6 hrs a night.  Support/encouragement given.  Safety maintained with q15 minute checks.

## 2013-01-13 NOTE — Progress Notes (Signed)
Recreation Therapy Notes  01/13/2013  Time: 3:00pm   Group Topic/Focus: Time Management   Participation Level:  Active   Participation Quality:  Appropriate   Affect:  Bright   Cognitive:  Appropriate   Additional Comments: Patient completed "Leisure Time Clock" worksheet. Patient participated in group discussion about using leisure time effectively.   Loretta Hood, LRT/CTRS  

## 2013-01-13 NOTE — Tx Team (Signed)
Interdisciplinary Treatment Plan Update (Adult)  Date:  01/13/2013  Time Reviewed:  10:40 AM   Progress in Treatment: Attending groups:   Yes   Participating in groups:  Yes Taking medication as prescribed:  Yes Tolerating medication:  Yes Family/Significant othe contact made: Contact made with family Patient understands diagnosis:  Yes Discussing patient identified problems/goals with staff: Yes Medical problems stabilized or resolved: Yes Denies suicidal/homicidal ideation:No, but contracts for safety Issues/concerns per patient self-inventory:  Other:   New problem(s) identified:  Reason for Continuation of Hospitalization: Anxiety Depression Medication stabilization Suicidal ideation  Interventions implemented related to continuation of hospitalization:  Medication Management; safety checks q 15 mins  Additional comments:  Estimated length of stay:  2-3 days  Discharge Plan:  Home with outpatient follow up  New goal(s):  Review of initial/current patient goals per problem list:    1.  Goal(s): Eliminate SI/other thoughts of self harm (Patient will no longer endorse SI/HI or thoughts of self harm)   Met:  No  Target date: d/c  As evidenced by: Patient continues to endorse SI but contracts for safety    2.  Goal (s):Reduce depression/anxiety (Paitent will rate symptoms at four or below)  Met: No  Target date: d/c  As evidenced by: Patient rates symptoms at nine today    3.  Goal(s):.stabilize on meds (Patient will report being stabilized on medications   Met:  No  Target date: d/c  As evidenced by: Patient continues to endorse symptom    4.  Goal(s): Refer for outpatient follow up (Follow up appointment will be scheduled)   Met:  No  Target date: d/c  As evidenced by: Follow up appointment has not been scheduled    Attendees: Patient:   01/13/2013 10:40 AM  Physican:  Patrick North, MD 01/13/2013 10:40 AM  Nursing: Andree Elk,  RN 01/13/2013 10:40 AM   Nursing:    Romeo Apple RN 01/13/2013 10:40 AM   Clinical Social Worker:  Juline Patch, LCSW 01/13/2013 10:40 AM   Other: Tera Helper, PHM-NP 01/13/2013 10:40 AM   Other:   01/13/2013 10:40 AM Other:        01/13/2013 10:40 AM

## 2013-01-13 NOTE — Progress Notes (Signed)
[facility] MD Progress Note  01/13/2013 12:38 PM Loretta Hood  MRN:  [id number] Subjective:  Patient continues to endorse anxiety and depressed mood. She reports high anxiety about discharge.  Diagnosis:   Axis I: Depressive Disorder NOS and Substance Abuse Axis II: Deferred Axis III:  Past Medical History  Diagnosis Date  . Anxiety   . Depression   . Alcoholic   . Drug addiction in remission   . Bipolar 1 disorder    Axis IV: other psychosocial or environmental problems Axis V: 51-60 moderate symptoms  ADL's:  Intact  Sleep: Fair  Appetite:  Fair  Psychiatric Specialty Exam: Review of Systems  Constitutional: Negative.   HENT: Negative.   Eyes: Negative.   Respiratory: Negative.   Cardiovascular: Negative.   Gastrointestinal: Negative.   Genitourinary: Negative.   Musculoskeletal: Negative.   Skin: Negative.   Neurological: Negative.   Endo/Heme/Allergies: Negative.   Psychiatric/Behavioral: Positive for depression and suicidal ideas. The patient is nervous/anxious.     Blood pressure 104/69, pulse 93, temperature 97.5 F (36.4 C), temperature source Oral, resp. rate 16, height 5' 3.5" (1.613 m), weight 77.111 kg (170 lb), last menstrual period 12/05/2012, SpO2 98.00%.Body mass index is 29.64 kg/(m^2).  General Appearance: Disheveled  Eye Contact::  Minimal  Speech:  Slow  Volume:  Decreased  Mood:  Depressed, Dysphoric and Hopeless  Affect:  Constricted and Depressed  Thought Process:  Coherent  Orientation:  Full (Time, Place, and Person)  Thought Content:  Rumination  Suicidal Thoughts:  Yes.  without intent/plan  Homicidal Thoughts:  No  Memory:  Immediate;   Fair Recent;   Fair Remote;   Fair  Judgement:  Fair  Insight:  Present  Psychomotor Activity:  Decreased  Concentration:  Fair  Recall:  Fair  Akathisia:  No  Handed:  Right  AIMS (if indicated):     Assets:  Communication Skills Desire for Improvement Social Support  Sleep:  Number of  Hours: 5.75   Current Medications: Current Facility-Administered Medications  Medication Dose Route Frequency Provider Last Rate Last Dose  . acetaminophen (TYLENOL) tablet 650 mg  650 mg Oral Q6H PRN Kerry Hough, PA   650 mg at 01/12/13 1309  . alum & mag hydroxide-simeth (MAALOX/MYLANTA) 200-200-20 MG/5ML suspension 30 mL  30 mL Oral Q4H PRN Kerry Hough, PA   30 mL at 01/12/13 2225  . DULoxetine (CYMBALTA) DR capsule 30 mg  30 mg Oral Daily Kerry Hough, PA   30 mg at 01/13/13 0750  . hydrOXYzine (ATARAX/VISTARIL) tablet 25 mg  25 mg Oral Q6H PRN Kerry Hough, PA   25 mg at 01/12/13 2136  . magnesium hydroxide (MILK OF MAGNESIA) suspension 30 mL  30 mL Oral Daily PRN Kerry Hough, PA      . risperiDONE (RISPERDAL M-TABS) disintegrating tablet 2 mg  2 mg Oral QHS Fredrik Cove, PA-C   2 mg at 01/12/13 2135  . traZODone (DESYREL) tablet 100 mg  100 mg Oral QHS Nanine Means, NP   100 mg at 01/12/13 2134  . venlafaxine (EFFEXOR) tablet 25 mg  25 mg Oral q morning - 10a Vassie Kugel, MD        Lab Results: No results found for this or any previous visit (from the past 48 hour(s)).  Physical Findings: AIMS: Facial and Oral Movements Muscles of Facial Expression: None, normal Lips and Perioral Area: None, normal Jaw: None, normal Tongue: None, normal,Extremity Movements Upper (arms, wrists, hands,  fingers): None, normal Lower (legs, knees, ankles, toes): None, normal, Trunk Movements Neck, shoulders, hips: None, normal, Overall Severity Severity of abnormal movements (highest score from questions above): None, normal Incapacitation due to abnormal movements: None, normal Patient's awareness of abnormal movements (rate only patient's report): No Awareness, Dental Status Current problems with teeth and/or dentures?: No Does patient usually wear dentures?: No  CIWA:  CIWA-Ar Total: 2 COWS:  COWS Total Score: 1  Treatment Plan Summary: Daily contact with patient to  assess and evaluate symptoms and progress in treatment Medication management  Plan: Discontinue Wellbutrin, patient says it has not been helpful. Start Effexor at 25mg  po qd. Encourage patient to attend groups, learn to use coping skills to deal with her anxiety.  Medical Decision Making Problem Points:  Established problem, stable/improving (1), Review of last therapy session (1) and Review of psycho-social stressors (1) Data Points:  Review of medication regiment & side effects (2) Review of new medications or change in dosage (2)  I certify that inpatient services furnished can reasonably be expected to improve the patient's condition.   Antionetta Ator 01/13/2013, 12:38 PM

## 2013-01-13 NOTE — Progress Notes (Signed)
[facility] LCSW Group Therapy       Feelings About Diagnosis 1:15 - 2:30 PM          01/13/2013 2:09 PM  Type of Therapy:  Group Therapy  Participation Level:  Active  Participation Quality:  Appropriate and Attentive  Affect:  Appropriate, Depressed and Flat  Cognitive:  Alert and Appropriate  Insight:  Engaged  Engagement in Therapy:  Engaged  Modes of Intervention:  Discussion, Exploration, Problem-solving, Rapport Building and Support  Summary of Progress/Problems:  Patient shared she becomes upset with family/friends are hovering over her or having a look of disappointment on their face regarding her.  Patient shared she is able to speak up and ask the person to talk with her about their concerns.  [name] 01/13/2013, 2:09 PM

## 2013-01-13 NOTE — Progress Notes (Signed)
D: Patient appropriate and cooperative with staff and peers. Patient's affect/mood is flat and depressed. Patient complained of bilateral leg pain 8/10; also c/o anxiety. She reported on the self inventory sheet that her appetite is improving, energy level is low, and ability to pay attention is poor. Patient rated depression and feelings of hopelessness "7".  A: Support and encouragement provided to patient. Scheduled medications administered per ordering MD. PRN Tylenol given for pain and Vistaril for anxiety. Monitor Q15 minute checks for safety.  R: Patient receptive. Passive SI, but contracts for safety. Denies HI/AVH. Patient remains safe.

## 2013-01-14 MED ORDER — PSEUDOEPHEDRINE HCL 30 MG PO TABS: 60.0000 mg | ORAL_TABLET | Freq: Three times a day (TID) | ORAL | Status: DC | PRN

## 2013-01-14 MED ORDER — FUROSEMIDE 20 MG PO TABS
10.0000 mg | ORAL_TABLET | ORAL | Status: DC
  Administered 2013-01-15: 06:00:00 via ORAL
  Filled 2013-01-14 (×3): qty 0.5
  Filled 2013-01-14: qty 1

## 2013-01-14 MED ORDER — VENLAFAXINE HCL 37.5 MG PO TABS
37.5000 mg | ORAL_TABLET | Freq: Every day | ORAL | Status: DC
Start: 2013-01-15 — End: 2013-01-15
  Administered 2013-01-15: 37.5 mg via ORAL
  Filled 2013-01-14: qty 1
  Filled 2013-01-14: qty 14
  Filled 2013-01-14: qty 1

## 2013-01-14 NOTE — Progress Notes (Signed)
[facility] MD Progress Note  01/14/2013 2:01 PM Loretta Hood  MRN:  [id number] Subjective:  Patient reporting high anxiety, states her legs are swollen and hurting. She reports being anxious about being discharged. She has been observed flirting with female peers and being very close to one of the female patients. This was addressed by the staff.  Diagnosis:   Axis I: Alcohol Abuse and Depressive Disorder NOS Axis II: Deferred Axis III:  Past Medical History  Diagnosis Date  . Anxiety   . Depression   . Alcoholic   . Drug addiction in remission   . Bipolar 1 disorder    Axis IV: other psychosocial or environmental problems Axis V: 51-60 moderate symptoms  ADL's:  Intact  Sleep: Fair  Appetite:  Fair   Psychiatric Specialty Exam: Review of Systems  Constitutional: Negative.   HENT: Negative.   Eyes: Negative.   Respiratory: Negative.   Cardiovascular: Negative.   Gastrointestinal: Negative.   Genitourinary: Negative.   Musculoskeletal: Negative.   Skin: Negative.   Neurological: Negative.   Endo/Heme/Allergies: Negative.   Psychiatric/Behavioral: Positive for depression. The patient is nervous/anxious.     Blood pressure 104/69, pulse 93, temperature 97.5 F (36.4 C), temperature source Oral, resp. rate 16, height 5' 3.5" (1.613 m), weight 77.111 kg (170 lb), last menstrual period 12/05/2012, SpO2 98.00%.Body mass index is 29.64 kg/(m^2).  General Appearance: Casual  Eye Contact::  Fair  Speech:  Pressured  Volume:  Normal  Mood:  Anxious and Depressed  Affect:  Congruent  Thought Process:  Coherent  Orientation:  Full (Time, Place, and Person)  Thought Content:  WDL  Suicidal Thoughts:  No  Homicidal Thoughts:  No  Memory:  Immediate;   Fair Recent;   Fair Remote;   Fair  Judgement:  Fair  Insight:  Fair  Psychomotor Activity:  Normal  Concentration:  Fair  Recall:  Fair  Akathisia:  No  Handed:  Right  AIMS (if indicated):     Assets:  Communication  Skills Desire for Improvement Social Support  Sleep:  Number of Hours: 5.75   Current Medications: Current Facility-Administered Medications  Medication Dose Route Frequency Provider Last Rate Last Dose  . acetaminophen (TYLENOL) tablet 650 mg  650 mg Oral Q6H PRN Kerry Hough, PA   650 mg at 01/13/13 2307  . alum & mag hydroxide-simeth (MAALOX/MYLANTA) 200-200-20 MG/5ML suspension 30 mL  30 mL Oral Q4H PRN Kerry Hough, PA   30 mL at 01/13/13 2355  . DULoxetine (CYMBALTA) DR capsule 30 mg  30 mg Oral Daily Kerry Hough, PA   30 mg at 01/14/13 0749  . hydrOXYzine (ATARAX/VISTARIL) tablet 25 mg  25 mg Oral Q6H PRN Kerry Hough, PA   25 mg at 01/13/13 2308  . magnesium hydroxide (MILK OF MAGNESIA) suspension 30 mL  30 mL Oral Daily PRN Kerry Hough, PA      . risperiDONE (RISPERDAL M-TABS) disintegrating tablet 2 mg  2 mg Oral QHS Fredrik Cove, PA-C   2 mg at 01/13/13 2139  . traZODone (DESYREL) tablet 100 mg  100 mg Oral QHS Nanine Means, NP   100 mg at 01/13/13 2139  . [START ON 01/15/2013] venlafaxine (EFFEXOR) tablet 37.5 mg  37.5 mg Oral Daily Orma Cheetham, MD        Lab Results: No results found for this or any previous visit (from the past 48 hour(s)).  Physical Findings: AIMS: Facial and Oral Movements Muscles of Facial  Expression: None, normal Lips and Perioral Area: None, normal Jaw: None, normal Tongue: None, normal,Extremity Movements Upper (arms, wrists, hands, fingers): None, normal Lower (legs, knees, ankles, toes): None, normal, Trunk Movements Neck, shoulders, hips: None, normal, Overall Severity Severity of abnormal movements (highest score from questions above): None, normal Incapacitation due to abnormal movements: None, normal Patient's awareness of abnormal movements (rate only patient's report): No Awareness, Dental Status Current problems with teeth and/or dentures?: No Does patient usually wear dentures?: No  CIWA:  CIWA-Ar Total: 2 COWS:   COWS Total Score: 1  Treatment Plan Summary: Daily contact with patient to assess and evaluate symptoms and progress in treatment Medication management  Plan: Met with patient`s boyfriend and mother after obtaining consent from patient. They report she is at baseline and does sleep a lot at home. They feel she is ready for discharge. Discussed with patient continuing her care outpatient since she has stabilized. Increase Effexor to 37.5 mg to address anxiety symptoms. Plan for discharge tomorrow.  Medical Decision Making Problem Points:  Established problem, stable/improving (1), Review of last therapy session (1) and Review of psycho-social stressors (1) Data Points:  Review of medication regiment & side effects (2)  I certify that inpatient services furnished can reasonably be expected to improve the patient's condition.   Minor Iden 01/14/2013, 2:01 PM

## 2013-01-14 NOTE — Progress Notes (Signed)
Adult Psychoeducational Group Note  Date:  01/14/2013 Time:  2:51 AM  Group Topic/Focus:  Wrap-Up Group:   The focus of this group is to help patients review their daily goal of treatment and discuss progress on daily workbooks.  Participation Level:  Active  Participation Quality:  Appropriate and Attentive  Affect:  Appropriate and Flat  Cognitive:  Appropriate  Insight: Good  Engagement in Group:  Improving  Modes of Intervention:  Discussion  Additional Comments:  Pt stated one good thing about her day was when she talked with her kids, they didn't ask her for anything, they just wanted to talk to her. Pt stated that it made her feel wanted and needed in a different way than she has ever felt. Pt stated that she is use to giving her kids things(material things) that they need/want.      Dalia Heading 01/14/2013, 2:51 AM

## 2013-01-14 NOTE — Progress Notes (Signed)
D: Patient cooperative with staff and peers. Patient's affect/mood is flat and depressed. Anxious at times. She reported on the self inventory sheet that her appetite is improving, energy level is low and ability to pay attention is poor. Patient rated depression "8" and feelings of hopelessness "7".   A: Support and encouragement provided to patient. Administered scheduled medications per MD orders. Maintain Q15 minute checks for safety.   R: Patient receptive. Denies SI/HI/AVH. Patient remains safe on the unit.

## 2013-01-14 NOTE — Progress Notes (Signed)
Pt reports she is doing ok tonight.  She says she is still quite anxious and regularly asking for Vistaril prn.  She denies SI/HI/AV at this time.  She has become very close to a female peer and has been observed sitting too close and allowing him to come into her room.  MHT found him hiding in her bathroom tonight.  She tends to be flirtacious with the other female patients during the leisure times.  She has been spoken to about her behaviors.  Support/encouragement given.  Safety maintained with q15 minute checks.
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
## 2013-01-14 NOTE — Progress Notes (Signed)
D: Patient in the day room interacting with peers at the beginning of the shift. She reported feeling very sad and depressed. Anxious about her possible discharge tomorrow. Patient stated ; "They said I will be living tomorrow but I don't feel ready to go; the thoughts of living tomorrow frightens me". She also reported having problem falling asleep. A: Writer encouraged and supported patient. D: Patient receptive to encouragement.

## 2013-01-15 MED ORDER — RISPERIDONE 2 MG PO TBDP: 2.0000 mg | ORAL_TABLET | Freq: Every day | ORAL | Status: DC

## 2013-01-15 MED ORDER — VENLAFAXINE HCL 37.5 MG PO TABS: 37.5000 mg | ORAL_TABLET | Freq: Every day | ORAL | Status: DC

## 2013-01-15 MED ORDER — HYDROXYZINE HCL 25 MG PO TABS: 25.0000 mg | ORAL_TABLET | Freq: Four times a day (QID) | ORAL | Status: DC | PRN

## 2013-01-15 MED ORDER — TRAZODONE HCL 100 MG PO TABS: 100.0000 mg | ORAL_TABLET | Freq: Every day | ORAL | Status: DC

## 2013-01-15 NOTE — Progress Notes (Signed)
Agree with assessment and plan Sharena Dibenedetto A. Xochitl Egle, M.D. 

## 2013-01-15 NOTE — Progress Notes (Signed)
Patient ID: Loretta Hood, female   DOB: 13-Sep-1976, 37 y.o.   MRN: [id number] Patient discharged per physician order; Patient denies SI/HI and A/V hallucinations; patient received samples, prescriptions, and copy of AVS after it was reviewed; patient stated she had no other questions or concerns at this time; patient verbalized that she received all belongings
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
## 2013-01-15 NOTE — [facility] Suicide Risk Assessment (Signed)
Suicide Risk Assessment  Discharge Assessment     Demographic Factors:  Female, Caucasian  Mental Status Per Nursing Assessment::   On Admission:  NA  Current Mental Status by Physician: Patient alert and oriented to 4. Denies aH/VH/SI/HI.  Loss Factors: Financial problems/change in socioeconomic status  Historical Factors: Impulsivity  Risk Reduction Factors:   Sense of responsibility to family and Positive social support  Continued Clinical Symptoms:  Depression:   Recent sense of peace/wellbeing  Cognitive Features That Contribute To Risk:  Cognitively intact    Suicide Risk:  Minimal: No identifiable suicidal ideation.  Patients presenting with no risk factors but with morbid ruminations; may be classified as minimal risk based on the severity of the depressive symptoms  Discharge Diagnoses:   AXIS I:  Anxiety Disorder NOS and Depressive Disorder NOS AXIS II:  Deferred AXIS III:   Past Medical History  Diagnosis Date  . Anxiety   . Depression   . Alcoholic   . Drug addiction in remission   . Bipolar 1 disorder    AXIS IV:  occupational problems and other psychosocial or environmental problems AXIS V:  61-70 mild symptoms  Plan Of Care/Follow-up recommendations:  Activity:  Regular Diet:  Regular Follow up with outpatient appointments.  Is patient on multiple antipsychotic therapies at discharge:  No   Has Patient had three or more failed trials of antipsychotic monotherapy by history:  No  Recommended Plan for Multiple Antipsychotic Therapies: NA  Darnise Montag 01/15/2013, 10:15 AM

## 2013-01-15 NOTE — Progress Notes (Signed)
Agree with assessment and plan Damaria Vachon A. Adiyah Lame, M.D. 

## 2013-01-19 NOTE — Discharge Summary (Signed)
Reviewed

## 2013-01-20 NOTE — Progress Notes (Signed)
Patient Discharge Instructions:  After Visit Summary (AVS):   Faxed to:  01/20/13 Discharge Summary Note:   Faxed to:  01/20/13 Psychiatric Admission Assessment Note:   Faxed to:  01/20/13 Suicide Risk Assessment - Discharge Assessment:   Faxed to:  01/20/13 Faxed/Sent to the Next Level Care provider:  01/20/13 Faxed to [facility] @ [phone]  Jerelene Redden, 01/20/2013, 4:04 PM

## 2013-09-29 ENCOUNTER — Encounter (HOSPITAL_BASED_OUTPATIENT_CLINIC_OR_DEPARTMENT_OTHER): Payer: Self-pay | Admitting: Emergency Medicine

## 2013-09-29 ENCOUNTER — Emergency Department (HOSPITAL_BASED_OUTPATIENT_CLINIC_OR_DEPARTMENT_OTHER)
Admission: EM | Admit: 2013-09-29 | Discharge: 2013-09-29 | Disposition: A | Discharge status: Home / Self Care | Payer: 59 | Attending: Emergency Medicine | Admitting: Emergency Medicine

## 2013-09-29 DIAGNOSIS — Z23 Encounter for immunization: Secondary | ICD-10-CM | POA: Insufficient documentation

## 2013-09-29 DIAGNOSIS — S61209A Unspecified open wound of unspecified finger without damage to nail, initial encounter: Secondary | ICD-10-CM | POA: Insufficient documentation

## 2013-09-29 DIAGNOSIS — S61219A Laceration without foreign body of unspecified finger without damage to nail, initial encounter: Secondary | ICD-10-CM

## 2013-09-29 DIAGNOSIS — F411 Generalized anxiety disorder: Secondary | ICD-10-CM | POA: Insufficient documentation

## 2013-09-29 DIAGNOSIS — Y929 Unspecified place or not applicable: Secondary | ICD-10-CM | POA: Insufficient documentation

## 2013-09-29 DIAGNOSIS — Z79899 Other long term (current) drug therapy: Secondary | ICD-10-CM | POA: Insufficient documentation

## 2013-09-29 DIAGNOSIS — Y9389 Activity, other specified: Secondary | ICD-10-CM | POA: Insufficient documentation

## 2013-09-29 DIAGNOSIS — Z87891 Personal history of nicotine dependence: Secondary | ICD-10-CM | POA: Insufficient documentation

## 2013-09-29 DIAGNOSIS — W292XXA Contact with other powered household machinery, initial encounter: Secondary | ICD-10-CM | POA: Insufficient documentation

## 2013-09-29 DIAGNOSIS — F319 Bipolar disorder, unspecified: Secondary | ICD-10-CM | POA: Insufficient documentation

## 2013-09-29 MED ORDER — TETANUS-DIPHTH-ACELL PERTUSSIS 5-2.5-18.5 LF-MCG/0.5 IM SUSP
0.5000 mL | Freq: Once | INTRAMUSCULAR | Status: AC
  Administered 2013-09-29: 0.5 mL via INTRAMUSCULAR
  Filled 2013-09-29: qty 0.5

## 2013-09-29 NOTE — ED Notes (Signed)
Cut left index finger with kitchen knife while cutting

## 2013-09-29 NOTE — ED Provider Notes (Signed)
Medical screening examination/treatment/procedure(s) were performed by non-physician practitioner and as supervising physician I was immediately available for consultation/collaboration.  EKG Interpretation   None        Toy Baker, MD 09/29/13 1331

## 2013-09-29 NOTE — ED Provider Notes (Signed)
CSN: [id number]     Arrival date & time 09/29/13  1236 History   First MD Initiated Contact with Patient 09/29/13 1245     Chief Complaint  Patient presents with  . Extremity Laceration   (Consider location/radiation/quality/duration/timing/severity/associated sxs/prior Treatment) HPI Comments: Pt states that she was cutting with a knife an hour ago and she cut her left index finger:pt has states that she has not been to get it to stop bleeding:no fb:no numbness  The history is provided by the patient. No language interpreter was used.    Past Medical History  Diagnosis Date  . Anxiety   . Depression   . Alcoholic   . Drug addiction in remission   . Bipolar 1 disorder    Past Surgical History  Procedure Laterality Date  . Cholecystectomy    . Tonsillectomy     No family history on file. History  Substance Use Topics  . Smoking status: Former Games developer  . Smokeless tobacco: Never Used  . Alcohol Use: No   OB History   Grav Para Term Preterm Abortions TAB SAB Ect Mult Living                 Review of Systems  Constitutional: Negative.   Respiratory: Negative.   Cardiovascular: Negative.     Allergies  Review of patient's allergies indicates no known allergies.  Home Medications   Current Outpatient Rx  Name  Route  Sig  Dispense  Refill  . GABAPENTIN, PHN, PO   Oral   Take by mouth.         . QUEtiapine Fumarate (SEROQUEL PO)   Oral   Take by mouth.         . hydrOXYzine (ATARAX/VISTARIL) 25 MG tablet   Oral   Take 1 tablet (25 mg total) by mouth every 6 (six) hours as needed for anxiety.   30 tablet   0   . risperiDONE (RISPERDAL M-TABS) 2 MG disintegrating tablet   Oral   Take 1 tablet (2 mg total) by mouth at bedtime. For thoughts/mood   30 tablet   0   . traZODone (DESYREL) 100 MG tablet   Oral   Take 1 tablet (100 mg total) by mouth at bedtime. For sleep   30 tablet   0   . venlafaxine (EFFEXOR) 37.5 MG tablet   Oral   Take 1 tablet  (37.5 mg total) by mouth daily. For anxiety/depression   30 tablet   0    BP 124/78  Pulse 88  Temp(Src) 98.2 F (36.8 C) (Oral)  Resp 16  Ht 5\' 3"  (1.6 m)  Wt 190 lb (86.183 kg)  BMI 33.67 kg/m2  SpO2 98%  LMP 09/17/2013 Physical Exam  Nursing note and vitals reviewed. Constitutional: She is oriented to person, place, and time. She appears well-developed and well-nourished.  Cardiovascular: Normal rate and regular rhythm.   Pulmonary/Chest: Effort normal and breath sounds normal.  Musculoskeletal: Normal range of motion.  Neurological: She is alert and oriented to person, place, and time.  Skin:  Pt has a superficial laceration to the medial aspect of the left index finger    ED Course  LACERATION REPAIR Date/Time: 09/29/2013 1:17 PM Performed by: Teressa Lower Authorized by: Teressa Lower Consent: Verbal consent obtained. Risks and benefits: risks, benefits and alternatives were discussed Consent given by: patient Patient identity confirmed: verbally with patient Body area: upper extremity Location details: left index finger Laceration length: 1.5 cm Foreign bodies: no foreign  bodies Irrigation solution: saline Skin closure: glue Technique: simple Approximation: close Approximation difficulty: simple Patient tolerance: Patient tolerated the procedure well with no immediate complications.   (including critical care time) Labs Review Labs Reviewed - No data to display Imaging Review No results found.  EKG Interpretation   None       MDM   1. Finger laceration, initial encounter    Wound repaired without any problem    Teressa Lower, NP 09/29/13 1318

## 2014-03-03 ENCOUNTER — Emergency Department (HOSPITAL_BASED_OUTPATIENT_CLINIC_OR_DEPARTMENT_OTHER)
Admission: EM | Admit: 2014-03-03 | Discharge: 2014-03-03 | Disposition: A | Discharge status: Home / Self Care | Payer: 59 | Attending: Emergency Medicine | Admitting: Emergency Medicine

## 2014-03-03 ENCOUNTER — Emergency Department (HOSPITAL_BASED_OUTPATIENT_CLINIC_OR_DEPARTMENT_OTHER): Payer: 59

## 2014-03-03 ENCOUNTER — Encounter (HOSPITAL_BASED_OUTPATIENT_CLINIC_OR_DEPARTMENT_OTHER): Payer: Self-pay | Admitting: Emergency Medicine

## 2014-03-03 DIAGNOSIS — F411 Generalized anxiety disorder: Secondary | ICD-10-CM | POA: Insufficient documentation

## 2014-03-03 DIAGNOSIS — Z23 Encounter for immunization: Secondary | ICD-10-CM | POA: Insufficient documentation

## 2014-03-03 DIAGNOSIS — L600 Ingrowing nail: Secondary | ICD-10-CM | POA: Insufficient documentation

## 2014-03-03 DIAGNOSIS — F319 Bipolar disorder, unspecified: Secondary | ICD-10-CM | POA: Insufficient documentation

## 2014-03-03 DIAGNOSIS — Z87891 Personal history of nicotine dependence: Secondary | ICD-10-CM | POA: Insufficient documentation

## 2014-03-03 DIAGNOSIS — Z79899 Other long term (current) drug therapy: Secondary | ICD-10-CM | POA: Insufficient documentation

## 2014-03-03 HISTORY — DX: Post-traumatic stress disorder, unspecified: F43.10

## 2014-03-03 MED ORDER — CEPHALEXIN 500 MG PO CAPS: 500.0000 mg | ORAL_CAPSULE | Freq: Four times a day (QID) | ORAL | Status: DC

## 2014-03-03 MED ORDER — TETANUS-DIPHTH-ACELL PERTUSSIS 5-2.5-18.5 LF-MCG/0.5 IM SUSP
0.5000 mL | Freq: Once | INTRAMUSCULAR | Status: AC
  Administered 2014-03-03: 0.5 mL via INTRAMUSCULAR
  Filled 2014-03-03: qty 0.5

## 2014-03-03 NOTE — ED Provider Notes (Signed)
Medical screening examination/treatment/procedure(s) were performed by non-physician practitioner and as supervising physician I was immediately available for consultation/collaboration.   EKG Interpretation None        Arlyn Bumpus, MD 03/03/14 2332 

## 2014-03-03 NOTE — ED Notes (Signed)
C/o left great toe nail "ingrown toe nail" since Oct 2014-worse x 1 week

## 2014-03-03 NOTE — ED Provider Notes (Signed)
CSN: [id number]     Arrival date & time 03/03/14  1807 History   First MD Initiated Contact with Patient 03/03/14 1818     Chief Complaint  Patient presents with  . Nail Problem     (Consider location/radiation/quality/duration/timing/severity/associated sxs/prior Treatment) HPI Comments: Pt states that she has an ingrown toe nail since October. Pt states that she it has gotten worse in the last week. Pt states that she has had some green drainage to the area  The history is provided by the patient. No language interpreter was used.    Past Medical History  Diagnosis Date  . Anxiety   . Depression   . Alcoholic   . Drug addiction in remission   . Bipolar 1 disorder   . PTSD (post-traumatic stress disorder)    Past Surgical History  Procedure Laterality Date  . Cholecystectomy    . Tonsillectomy    . Tubal ligation     No family history on file. History  Substance Use Topics  . Smoking status: Former Games developermoker  . Smokeless tobacco: Never Used  . Alcohol Use: No   OB History   Grav Para Term Preterm Abortions TAB SAB Ect Mult Living                 Review of Systems  Constitutional: Negative.   Respiratory: Negative.   Cardiovascular: Negative.       Allergies  Review of patient's allergies indicates no known allergies.  Home Medications   Current Outpatient Rx  Name  Route  Sig  Dispense  Refill  . ALBUTEROL IN   Inhalation   Inhale into the lungs.         Marland Kitchen. GABAPENTIN, PHN, PO   Oral   Take by mouth.         . hydrOXYzine (ATARAX/VISTARIL) 25 MG tablet   Oral   Take 1 tablet (25 mg total) by mouth every 6 (six) hours as needed for anxiety.   30 tablet   0   . QUEtiapine Fumarate (SEROQUEL PO)   Oral   Take by mouth.         . risperiDONE (RISPERDAL M-TABS) 2 MG disintegrating tablet   Oral   Take 1 tablet (2 mg total) by mouth at bedtime. For thoughts/mood   30 tablet   0   . traZODone (DESYREL) 100 MG tablet   Oral   Take 1 tablet  (100 mg total) by mouth at bedtime. For sleep   30 tablet   0   . venlafaxine (EFFEXOR) 37.5 MG tablet   Oral   Take 1 tablet (37.5 mg total) by mouth daily. For anxiety/depression   30 tablet   0    BP 133/76  Pulse 84  Temp(Src) 98.3 F (36.8 C) (Oral)  Resp 20  Ht 5\' 3"  (1.6 m)  Wt 200 lb (90.719 kg)  BMI 35.44 kg/m2  SpO2 100%  LMP 02/12/2014 Physical Exam  Nursing note and vitals reviewed. Constitutional: She is oriented to person, place, and time. She appears well-developed and well-nourished.  Cardiovascular: Normal rate and regular rhythm.   Pulmonary/Chest: Effort normal.  Musculoskeletal: Normal range of motion.  Neurological: She is alert and oriented to person, place, and time.  Skin:  Pt has redness and warmth noted to half of the great toe. Pt has green discoloration under the toe. Skin is overgrown on both side of the nail    ED Course  NAIL REMOVAL Date/Time: 03/03/2014 7:32 PM  Performed by: Teressa Lower Authorized by: Teressa Lower Consent: Verbal consent obtained. Risks and benefits: risks, benefits and alternatives were discussed Consent given by: patient Patient identity confirmed: verbally with patient Time out: Immediately prior to procedure a "time out" was called to verify the correct patient, procedure, equipment, support staff and site/side marked as required. Anesthesia: digital block Local anesthetic: lidocaine 2% without epinephrine Preparation: skin prepped with ChloraPrep Amount removed: 1/2 Side: radial and ulnar Wedge excision of skin of nail fold: yes Nail bed sutured: no Removed nail replaced and anchored: no Dressing: petrolatum-impregnated gauze Patient tolerance: Patient tolerated the procedure well with no immediate complications.   (including critical care time) Labs Review Labs Reviewed - No data to display Imaging Review Dg Toe Great Left  03/03/2014   CLINICAL DATA:  Ingrown toenail  EXAM: LEFT GREAT TOE   COMPARISON:  None.  FINDINGS: Three views of the left great toe submitted. No acute fracture or subluxation. No periosteal reaction or bony erosion to suggest osteomyelitis.  IMPRESSION: No acute fracture or subluxation. No periosteal reaction or bony erosion.   Electronically Signed   By: Natasha Mead M.D.   On: 03/03/2014 19:20     EKG Interpretation None      MDM   Final diagnoses:  Ingrown toenail    No sign of osteomylitis noted. Will place on keflex for infection    Teressa Lower, NP 03/03/14 1933

## 2014-03-03 NOTE — Discharge Instructions (Signed)
Infected Ingrown Toenail  An infected ingrown toenail occurs when the nail edge grows into the skin and bacteria invade the area. Symptoms include pain, tenderness, swelling, and pus drainage from the edge of the nail. Poorly fitting shoes, minor injuries, and improper cutting of the toenail may also contribute to the problem. You should cut your toenails squarely instead of rounding the edges. Do not cut them too short. Avoid tight or pointed toe shoes. Sometimes the ingrown portion of the nail must be removed. If your toenail is removed, it can take 3-4 months for it to re-grow.  HOME CARE INSTRUCTIONS    Soak your infected toe in warm water for 20-30 minutes, 2 to 3 times a day.   Packing or dressings applied to the area should be changed daily.   Take medicine as directed and finish them.   Reduce activities and keep your foot elevated when able to reduce swelling and discomfort. Do this until the infection gets better.   Wear sandals or go barefoot as much as possible while the infected area is sensitive.   See your caregiver for follow-up care in 2-3 days if the infection is not better.  SEEK MEDICAL CARE IF:   Your toe is becoming more red, swollen or painful.  MAKE SURE YOU:    Understand these instructions.   Will watch your condition.   Will get help right away if you are not doing well or get worse.  Document Released: 12/26/2004 Document Revised: 02/10/2012 Document Reviewed: 11/14/2008  ExitCare Patient Information 2014 [employer].

## 2014-05-19 ENCOUNTER — Encounter (HOSPITAL_BASED_OUTPATIENT_CLINIC_OR_DEPARTMENT_OTHER): Payer: Self-pay | Admitting: Emergency Medicine

## 2014-05-19 ENCOUNTER — Emergency Department (HOSPITAL_BASED_OUTPATIENT_CLINIC_OR_DEPARTMENT_OTHER)
Admission: EM | Admit: 2014-05-19 | Discharge: 2014-05-19 | Disposition: A | Discharge status: Home / Self Care | Payer: 59 | Attending: Emergency Medicine | Admitting: Emergency Medicine

## 2014-05-19 ENCOUNTER — Emergency Department (HOSPITAL_BASED_OUTPATIENT_CLINIC_OR_DEPARTMENT_OTHER): Payer: 59

## 2014-05-19 DIAGNOSIS — L03032 Cellulitis of left toe: Secondary | ICD-10-CM

## 2014-05-19 DIAGNOSIS — L03039 Cellulitis of unspecified toe: Secondary | ICD-10-CM | POA: Insufficient documentation

## 2014-05-19 DIAGNOSIS — Z79899 Other long term (current) drug therapy: Secondary | ICD-10-CM | POA: Insufficient documentation

## 2014-05-19 DIAGNOSIS — F4310 Post-traumatic stress disorder, unspecified: Secondary | ICD-10-CM | POA: Insufficient documentation

## 2014-05-19 DIAGNOSIS — Z792 Long term (current) use of antibiotics: Secondary | ICD-10-CM | POA: Insufficient documentation

## 2014-05-19 DIAGNOSIS — Z87891 Personal history of nicotine dependence: Secondary | ICD-10-CM | POA: Insufficient documentation

## 2014-05-19 DIAGNOSIS — F319 Bipolar disorder, unspecified: Secondary | ICD-10-CM | POA: Insufficient documentation

## 2014-05-19 DIAGNOSIS — F411 Generalized anxiety disorder: Secondary | ICD-10-CM | POA: Insufficient documentation

## 2014-05-19 MED ORDER — FLUCONAZOLE 150 MG PO TABS: 150.0000 mg | ORAL_TABLET | Freq: Once | ORAL | Status: DC

## 2014-05-19 MED ORDER — FLUCONAZOLE 50 MG PO TABS: 150.0000 mg | ORAL_TABLET | Freq: Once | ORAL | Status: DC

## 2014-05-19 MED ORDER — CLINDAMYCIN HCL 150 MG PO CAPS: 450.0000 mg | ORAL_CAPSULE | Freq: Three times a day (TID) | ORAL | Status: DC

## 2014-05-19 MED ORDER — IBUPROFEN 600 MG PO TABS: 600.0000 mg | ORAL_TABLET | Freq: Four times a day (QID) | ORAL | Status: DC | PRN

## 2014-05-19 NOTE — ED Notes (Signed)
Called Dr. Vergia AlconHudnall--left message--call back to [phone]

## 2014-05-19 NOTE — ED Notes (Signed)
Pt. Reports she has been to ED for same complaint she has today.  Pt. Has noted redness and edema with pus drainage to the L great toe.

## 2014-05-19 NOTE — ED Notes (Signed)
Xeroform gauze followed by dry gauze dressing applied to left great toe.

## 2014-05-19 NOTE — ED Provider Notes (Signed)
Patient seen by me briefly. Has paronychia of the great toe. Patient requesting prescription for Diflucan as she  commonly gets yeast infections as result of being prescribed antibiotics. Diflucan 150 mg x1 dose prescribed by me .  Doug SouSam Yobany Vroom, MD 05/19/14 [phone]

## 2014-05-19 NOTE — Discharge Instructions (Signed)
Paronychia  Paronychia is an infection of the skin caused by germs. It happens by the fingernail or toenail. You can avoid it by not:  Pulling on hangnails.  Nail biting.  Thumb sucking.  Cutting fingernails and toenails too short.  Cutting the skin at the base and sides of the fingernail or toenail (cuticle). HOME CARE  Keep the fingers or toes very dry. Put rubber gloves over cotton gloves when putting hands in water.  Keep the wound clean and bandaged (dressed) as told by your doctor.  Soak the fingers or toes in warm water for 15 to 20 minutes. Soak them 3 to 4 times per day for germ infections. Fungal infections are difficult to treat. Fungal infections often require treatment for a long time.  Only take medicine as told by your doctor. GET HELP RIGHT AWAY IF:   You have redness, puffiness (swelling), or pain that gets worse.  You see yellowish-white fluid (pus) coming from the wound.  You have a fever.  You have a bad smell coming from the wound or bandage. MAKE SURE YOU:  Understand these instructions.  Will watch your condition.  Will get help if you are not doing well or get worse. Document Released: 11/06/2009 Document Revised: 02/10/2012 Document Reviewed: 11/06/2009 [facility] LtdExitCare Patient Information 2015 [place]. This information is not intended to replace advice given to you by your health care provider. Make sure you discuss any questions you have with your health care provider.  Cellulitis Cellulitis is an infection of the skin and the tissue under the skin. The infected area is usually red and tender. This happens most often in the arms and lower legs. HOME CARE   Take your antibiotic medicine as told. Finish the medicine even if you start to feel better.  Keep the infected arm or leg raised (elevated).  Put a warm cloth on the area up to 4 times per day.  Only take medicines as told by your doctor.  Keep all doctor visits as told. GET HELP  RIGHT AWAY IF:   You have a fever.  You feel very sleepy.  You throw up (vomit) or have watery poop (diarrhea).  You feel sick and have muscle aches and pains.  You see red streaks on the skin coming from the infected area.  Your red area gets bigger or turns a dark color.  Your bone or joint under the infected area is painful after the skin heals.  Your infection comes back in the same area or different area.  You have a puffy (swollen) bump in the infected area.  You have new symptoms. MAKE SURE YOU:   Understand these instructions.  Will watch your condition.  Will get help right away if you are not doing well or get worse. Document Released: 05/06/2008 Document Revised: 05/19/2012 Document Reviewed: 02/03/2012 [facility] Patient Information 2015 [place], [place]. This information is not intended to replace advice given to you by your health care provider. Make sure you discuss any questions you have with your health care provider.

## 2014-05-19 NOTE — ED Provider Notes (Signed)
CSN: [id number]     Arrival date & time 05/19/14  1345 History   First MD Initiated Contact with Patient 05/19/14 1359     Chief Complaint  Patient presents with  . Toe Pain     (Consider location/radiation/quality/duration/timing/severity/associated sxs/prior Treatment) Patient is a 38 y.o. female presenting with toe pain. The history is provided by the patient. No language interpreter was used.  Toe Pain This is a chronic problem. The current episode started more than 1 week ago (8 months). The problem occurs constantly. The problem has been gradually worsening. Pertinent negatives include no chest pain, no abdominal pain, no headaches and no shortness of breath. Exacerbated by: walking. The symptoms are relieved by rest. Treatments tried: keflex.    Past Medical History  Diagnosis Date  . Anxiety   . Depression   . Alcoholic   . Drug addiction in remission   . Bipolar 1 disorder   . PTSD (post-traumatic stress disorder)    Past Surgical History  Procedure Laterality Date  . Cholecystectomy    . Tonsillectomy    . Tubal ligation     No family history on file. History  Substance Use Topics  . Smoking status: Former Games developermoker  . Smokeless tobacco: Never Used  . Alcohol Use: No   OB History   Grav Para Term Preterm Abortions TAB SAB Ect Mult Living                 Review of Systems  Constitutional: Negative for fever, chills, diaphoresis, activity change, appetite change and fatigue.  HENT: Negative for congestion, facial swelling, rhinorrhea and sore throat.   Eyes: Negative for photophobia and discharge.  Respiratory: Negative for cough, chest tightness and shortness of breath.   Cardiovascular: Negative for chest pain, palpitations and leg swelling.  Gastrointestinal: Negative for nausea, vomiting, abdominal pain and diarrhea.  Endocrine: Negative for polydipsia and polyuria.  Genitourinary: Negative for dysuria, frequency, difficulty urinating and pelvic pain.    Musculoskeletal: Negative for arthralgias, back pain, neck pain and neck stiffness.  Skin: Positive for wound. Negative for color change.  Allergic/Immunologic: Negative for immunocompromised state.  Neurological: Negative for facial asymmetry, weakness, numbness and headaches.  Hematological: Does not bruise/bleed easily.  Psychiatric/Behavioral: Negative for confusion and agitation.      Allergies  Review of patient's allergies indicates no known allergies.  Home Medications   Prior to Admission medications   Medication Sig Start Date End Date Taking? Authorizing Provider  ALBUTEROL IN Inhale into the lungs.    Historical Provider, MD  cephALEXin (KEFLEX) 500 MG capsule Take 1 capsule (500 mg total) by mouth 4 (four) times daily. 03/03/14   Teressa LowerVrinda Pickering, NP  clindamycin (CLEOCIN) 150 MG capsule Take 3 capsules (450 mg total) by mouth 3 (three) times daily. 05/19/14   Shanna CiscoMegan E Docherty, MD  GABAPENTIN, PHN, PO Take by mouth.    Historical Provider, MD  hydrOXYzine (ATARAX/VISTARIL) 25 MG tablet Take 1 tablet (25 mg total) by mouth every 6 (six) hours as needed for anxiety. 01/15/13   Himabindu Ravi, MD  ibuprofen (ADVIL,MOTRIN) 600 MG tablet Take 1 tablet (600 mg total) by mouth every 6 (six) hours as needed. 05/19/14   Shanna CiscoMegan E Docherty, MD  QUEtiapine Fumarate (SEROQUEL PO) Take by mouth.    Historical Provider, MD  risperiDONE (RISPERDAL M-TABS) 2 MG disintegrating tablet Take 1 tablet (2 mg total) by mouth at bedtime. For thoughts/mood 01/15/13   Patrick NorthHimabindu Ravi, MD  traZODone (DESYREL) 100 MG tablet  Take 1 tablet (100 mg total) by mouth at bedtime. For sleep 01/15/13   Himabindu Daleen Bo, MD  venlafaxine (EFFEXOR) 37.5 MG tablet Take 1 tablet (37.5 mg total) by mouth daily. For anxiety/depression 01/15/13   Himabindu Ravi, MD   BP 128/81  Pulse 68  Temp(Src) 98.2 F (36.8 C) (Oral)  Resp 16  Ht 5\' 4"  (1.626 m)  Wt 200 lb (90.719 kg)  BMI 34.31 kg/m2  SpO2 100%  LMP  05/11/2014 Physical Exam  Constitutional: She is oriented to person, place, and time. She appears well-developed and well-nourished. No distress.  HENT:  Head: Normocephalic and atraumatic.  Mouth/Throat: No oropharyngeal exudate.  Eyes: Pupils are equal, round, and reactive to light.  Neck: Normal range of motion. Neck supple.  Cardiovascular: Normal rate, regular rhythm and normal heart sounds.  Exam reveals no gallop and no friction rub.   No murmur heard. Pulmonary/Chest: Effort normal and breath sounds normal. No respiratory distress. She has no wheezes. She has no rales.  Abdominal: Soft. Bowel sounds are normal. She exhibits no distension and no mass. There is no tenderness. There is no rebound and no guarding.  Musculoskeletal: Normal range of motion. She exhibits no edema.       Left foot: She exhibits tenderness, swelling and deformity (of nail).       Feet:  Neurological: She is alert and oriented to person, place, and time.  Skin: Skin is warm and dry.  Psychiatric: She has a normal mood and affect.    ED Course  NAIL REMOVAL Date/Time: 05/19/2014 4:55 PM Performed by: Toy Cookey, E Authorized by: Toy Cookey, E Consent: Verbal consent obtained. written consent not obtained. Risks and benefits: risks, benefits and alternatives were discussed Consent given by: patient Patient understanding: patient states understanding of the procedure being performed Patient consent: the patient's understanding of the procedure matches consent given Procedure consent: procedure consent matches procedure scheduled Relevant documents: relevant documents present and verified Test results: test results available and properly labeled Site marked: the operative site was marked Imaging studies: imaging studies available Required items: required blood products, implants, devices, and special equipment available Patient identity confirmed: verbally with patient Location: left  foot Location details: left big toe Anesthesia: digital block Local anesthetic: lidocaine 1% without epinephrine Anesthetic total: 5 ml Patient sedated: no Preparation: skin prepped with Betadine Amount removed: partial Side: radial and ulnar Wedge excision of skin of nail fold: yes Nail bed sutured: no Nail matrix removed: none Removed nail replaced and anchored: no Dressing: Xeroform gauze Patient tolerance: Patient tolerated the procedure well with no immediate complications.   (including critical care time) Labs Review Labs Reviewed - No data to display  Imaging Review Dg Toe Great Left  05/19/2014   CLINICAL DATA:  Pain and swelling of the left great toe.  EXAM: LEFT GREAT TOE  COMPARISON:  Plain films left great toe 03/03/2014.  FINDINGS: Imaged bones, joints and soft tissues appear normal.  IMPRESSION: Negative exam.   Electronically Signed   By: Drusilla Kanner M.D.   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 female with Pmhx as above who presents with chronic paronychia of L great toe, with initial pain/swelling being unilateral beginning 8 months ago, was treated w/ keflex 2 months ago. She has not had the money to f/u with a  podiatrist. Bl sides of paronychial fold are edematous w/ purulent drainage, granulation tissue. There is foul smell and chronic appearing nail breakdown.  XR negative for osteo. Partial BL nail excision done exposing paronychial folds. WIll place on clinda. Rec continued warm soaks. Pt will f/u with Dr. Lazaro ArmsHudnall's office. Return precautions given for new or worsening symptoms including worsening pain, redness, fever.          Shanna CiscoMegan E Docherty, MD 05/19/14 [phone]

## 2014-05-19 NOTE — ED Notes (Signed)
Called Dr. Pearletha ForgeHudnall --will return call to Dr. Park Popeockerty at [phone]

## 2014-09-19 ENCOUNTER — Encounter (HOSPITAL_BASED_OUTPATIENT_CLINIC_OR_DEPARTMENT_OTHER): Payer: Self-pay | Admitting: Emergency Medicine

## 2014-09-19 ENCOUNTER — Emergency Department (HOSPITAL_BASED_OUTPATIENT_CLINIC_OR_DEPARTMENT_OTHER)
Admission: EM | Admit: 2014-09-19 | Discharge: 2014-09-19 | Disposition: A | Discharge status: Home / Self Care | Payer: 59 | Attending: Emergency Medicine | Admitting: Emergency Medicine

## 2014-09-19 DIAGNOSIS — R112 Nausea with vomiting, unspecified: Secondary | ICD-10-CM | POA: Insufficient documentation

## 2014-09-19 DIAGNOSIS — Z79899 Other long term (current) drug therapy: Secondary | ICD-10-CM | POA: Insufficient documentation

## 2014-09-19 DIAGNOSIS — L600 Ingrowing nail: Secondary | ICD-10-CM

## 2014-09-19 DIAGNOSIS — Z792 Long term (current) use of antibiotics: Secondary | ICD-10-CM | POA: Insufficient documentation

## 2014-09-19 DIAGNOSIS — K253 Acute gastric ulcer without hemorrhage or perforation: Secondary | ICD-10-CM | POA: Insufficient documentation

## 2014-09-19 DIAGNOSIS — Z87891 Personal history of nicotine dependence: Secondary | ICD-10-CM | POA: Insufficient documentation

## 2014-09-19 DIAGNOSIS — F419 Anxiety disorder, unspecified: Secondary | ICD-10-CM | POA: Insufficient documentation

## 2014-09-19 DIAGNOSIS — F4310 Post-traumatic stress disorder, unspecified: Secondary | ICD-10-CM | POA: Insufficient documentation

## 2014-09-19 DIAGNOSIS — F319 Bipolar disorder, unspecified: Secondary | ICD-10-CM | POA: Insufficient documentation

## 2014-09-19 LAB — CBC WITH DIFFERENTIAL/PLATELET
BASOS PCT: 0 % (ref 0–1)
Basophils Absolute: 0 10*3/uL (ref 0.0–0.1)
Eosinophils Absolute: 0.5 10*3/uL (ref 0.0–0.7)
Eosinophils Relative: 4 % (ref 0–5)
HEMATOCRIT: 40.4 % (ref 36.0–46.0)
Hemoglobin: 13.5 g/dL (ref 12.0–15.0)
LYMPHS PCT: 15 % (ref 12–46)
Lymphs Abs: 2 10*3/uL (ref 0.7–4.0)
MCH: 29.2 pg (ref 26.0–34.0)
MCHC: 33.4 g/dL (ref 30.0–36.0)
MCV: 87.3 fL (ref 78.0–100.0)
MONO ABS: 1.2 10*3/uL — AB (ref 0.1–1.0)
MONOS PCT: 9 % (ref 3–12)
NEUTROS ABS: 9.2 10*3/uL — AB (ref 1.7–7.7)
Neutrophils Relative %: 72 % (ref 43–77)
Platelets: 288 10*3/uL (ref 150–400)
RBC: 4.63 MIL/uL (ref 3.87–5.11)
RDW: 12.7 % (ref 11.5–15.5)
WBC: 12.9 10*3/uL — ABNORMAL HIGH (ref 4.0–10.5)

## 2014-09-19 LAB — URINALYSIS, ROUTINE W REFLEX MICROSCOPIC
Bilirubin Urine: NEGATIVE
GLUCOSE, UA: NEGATIVE mg/dL
Ketones, ur: NEGATIVE mg/dL
LEUKOCYTES UA: NEGATIVE
NITRITE: NEGATIVE
PH: 8 (ref 5.0–8.0)
Protein, ur: NEGATIVE mg/dL
SPECIFIC GRAVITY, URINE: 1.018 (ref 1.005–1.030)
Urobilinogen, UA: 1 mg/dL (ref 0.0–1.0)

## 2014-09-19 LAB — COMPREHENSIVE METABOLIC PANEL
ALT: 24 U/L (ref 0–35)
AST: 16 U/L (ref 0–37)
Albumin: 3.5 g/dL (ref 3.5–5.2)
Alkaline Phosphatase: 80 U/L (ref 39–117)
Anion gap: 13 (ref 5–15)
BILIRUBIN TOTAL: 0.3 mg/dL (ref 0.3–1.2)
BUN: 9 mg/dL (ref 6–23)
CHLORIDE: 102 meq/L (ref 96–112)
CO2: 26 meq/L (ref 19–32)
Calcium: 9 mg/dL (ref 8.4–10.5)
Creatinine, Ser: 0.9 mg/dL (ref 0.50–1.10)
GFR calc Af Amer: >90 mL/min (ref >90)
GFR, EST NON AFRICAN AMERICAN: 80 mL/min — AB (ref >90)
Glucose, Bld: 105 mg/dL — ABNORMAL HIGH (ref 70–99)
POTASSIUM: 3.9 meq/L (ref 3.7–5.3)
SODIUM: 141 meq/L (ref 137–147)
Total Protein: 6.7 g/dL (ref 6.0–8.3)

## 2014-09-19 LAB — URINE MICROSCOPIC-ADD ON

## 2014-09-19 LAB — LIPASE, BLOOD: Lipase: 26 U/L (ref 11–59)

## 2014-09-19 MED ORDER — PANTOPRAZOLE SODIUM 20 MG PO TBEC: 20.0000 mg | DELAYED_RELEASE_TABLET | Freq: Every day | ORAL | Status: DC

## 2014-09-19 MED ORDER — DOXYCYCLINE HYCLATE 100 MG PO CAPS: 100.0000 mg | ORAL_CAPSULE | Freq: Two times a day (BID) | ORAL | Status: DC

## 2014-09-19 MED ORDER — OMEPRAZOLE 20 MG PO CPDR: 20.0000 mg | DELAYED_RELEASE_CAPSULE | Freq: Every day | ORAL | Status: DC

## 2014-09-19 MED ORDER — ACETAMINOPHEN 325 MG PO TABS
650.0000 mg | ORAL_TABLET | Freq: Once | ORAL | Status: AC
  Administered 2014-09-19: 650 mg via ORAL
  Filled 2014-09-19: qty 2

## 2014-09-19 MED ORDER — ONDANSETRON HCL 4 MG/2ML IJ SOLN
4.0000 mg | Freq: Once | INTRAMUSCULAR | Status: AC
  Administered 2014-09-19: 4 mg via INTRAVENOUS
  Filled 2014-09-19: qty 2

## 2014-09-19 MED ORDER — SODIUM CHLORIDE 0.9 % IV BOLUS (SEPSIS)
1000.0000 mL | Freq: Once | INTRAVENOUS | Status: AC
  Administered 2014-09-19: 1000 mL via INTRAVENOUS

## 2014-09-19 MED ORDER — GI COCKTAIL ~~LOC~~
30.0000 mL | Freq: Once | ORAL | Status: AC
  Administered 2014-09-19: 30 mL via ORAL
  Filled 2014-09-19: qty 30

## 2014-09-19 NOTE — ED Notes (Signed)
Pt amb to room 2 with quick steady gait in nad. Pt reports nausea x Friday, with epigastric pain, vomitting Saturday, none since then. No diarrhea. Pt also reports cough and body aches.

## 2014-09-19 NOTE — ED Notes (Signed)
Pt states that she has an "infected ingrown toenail" that she wants the pcp to examine. MD informed.

## 2014-09-19 NOTE — Discharge Instructions (Signed)
Infected Ingrown Toenail An infected ingrown toenail occurs when the nail edge grows into the skin and bacteria invade the area. Symptoms include pain, tenderness, swelling, and pus drainage from the edge of the nail. Poorly fitting shoes, minor injuries, and improper cutting of the toenail may also contribute to the problem. You should cut your toenails squarely instead of rounding the edges. Do not cut them too short. Avoid tight or pointed toe shoes. Sometimes the ingrown portion of the nail must be removed. If your toenail is removed, it can take 3-4 months for it to re-grow. HOME CARE INSTRUCTIONS   Soak your infected toe in warm water for 20-30 minutes, 2 to 3 times a day.  Packing or dressings applied to the area should be changed daily.  Take medicine as directed and finish them.  Reduce activities and keep your foot elevated when able to reduce swelling and discomfort. Do this until the infection gets better.  Wear sandals or go barefoot as much as possible while the infected area is sensitive.  See your caregiver for follow-up care in 2-3 days if the infection is not better. SEEK MEDICAL CARE IF:  Your toe is becoming more red, swollen or painful. MAKE SURE YOU:   Understand these instructions.  Will watch your condition.  Will get help right away if you are not doing well or get worse. Document Released: 12/26/2004 Document Revised: 02/10/2012 Document Reviewed: 11/14/2008 [facility] Patient Information 2015 [place], [place]. This information is not intended to replace advice given to you by your health care provider. Make sure you discuss any questions you have with your health care provider.  Peptic Ulcer A peptic ulcer is a sore in the lining of your esophagus (esophageal ulcer), stomach (gastric ulcer), or in the first part of your small intestine (duodenal ulcer). The ulcer causes erosion into the deeper tissue. CAUSES  Normally, the lining of the stomach and the small  intestine protects itself from the acid that digests food. The protective lining can be damaged by:  An infection caused by a bacterium called Helicobacter pylori (H. pylori).  Regular use of nonsteroidal anti-inflammatory drugs (NSAIDs), such as ibuprofen or aspirin.  Smoking tobacco. Other risk factors include being older than 50, drinking alcohol excessively, and having a family history of ulcer disease.  SYMPTOMS   Burning pain or gnawing in the area between the chest and the belly button.  Heartburn.  Nausea and vomiting.  Bloating. The pain can be worse on an empty stomach and at night. If the ulcer results in bleeding, it can cause:  Black, tarry stools.  Vomiting of bright red blood.  Vomiting of coffee-ground-looking materials. DIAGNOSIS  A diagnosis is usually made based upon your history and an exam. Other tests and procedures may be performed to find the cause of the ulcer. Finding a cause will help determine the best treatment. Tests and procedures may include:  Blood tests, stool tests, or breath tests to check for the bacterium H. pylori.  An upper gastrointestinal (GI) series of the esophagus, stomach, and small intestine.  An endoscopy to examine the esophagus, stomach, and small intestine.  A biopsy. TREATMENT  Treatment may include:  Eliminating the cause of the ulcer, such as smoking, NSAIDs, or alcohol.  Medicines to reduce the amount of acid in your digestive tract.  Antibiotic medicines if the ulcer is caused by the H. pylori bacterium.  An upper endoscopy to treat a bleeding ulcer.  Surgery if the bleeding is severe or  if the ulcer created a hole somewhere in the digestive system. HOME CARE INSTRUCTIONS   Avoid tobacco, alcohol, and caffeine. Smoking can increase the acid in the stomach, and continued smoking will impair the healing of ulcers.  Avoid foods and drinks that seem to cause discomfort or aggravate your ulcer.  Only take medicines  as directed by your caregiver. Do not substitute over-the-counter medicines for prescription medicines without talking to your caregiver.  Keep any follow-up appointments and tests as directed. SEEK MEDICAL CARE IF:   Your do not improve within 7 days of starting treatment.  You have ongoing indigestion or heartburn. SEEK IMMEDIATE MEDICAL CARE IF:   You have sudden, sharp, or persistent abdominal pain.  You have bloody or dark black, tarry stools.  You vomit blood or vomit that looks like coffee grounds.  You become light-headed, weak, or feel faint.  You become sweaty or clammy. MAKE SURE YOU:   Understand these instructions.  Will watch your condition.  Will get help right away if you are not doing well or get worse. Document Released: 11/15/2000 Document Revised: 04/04/2014 Document Reviewed: 06/17/2012 [facility] Patient Information 2015 [place], [place]. This information is not intended to replace advice given to you by your health care provider. Make sure you discuss any questions you have with your health care provider.

## 2014-09-19 NOTE — ED Provider Notes (Signed)
CSN: [id number]     Arrival date & time 09/19/14  4098 History   First MD Initiated Contact with Patient 09/19/14 [phone]     Chief Complaint  Patient presents with  . Abdominal Pain     (Consider location/radiation/quality/duration/timing/severity/associated sxs/prior Treatment) HPI  Patient w/PMH of anxiety, depression, cholecystectomy, bipolar 1 disorder, PTSD to the ER with complaints of abdominal pains since Friday. She reports it started with epigastric pain and vomiting.  She reports having a metallic taste in her mouth afterwards. Her pain wraps around towards her back. She is currently on her menstrual cycle and has been also having pelvic cramps. Denies dysuria, hematuria, vaginal discharge, vaginal odor, diarrhea. She endorses having a fever of 100 over the weekend. Denies sore throat, coughing, CP, SOB, confusion.  Past Medical History  Diagnosis Date  . Anxiety   . Depression   . Alcoholic   . Drug addiction in remission   . Bipolar 1 disorder   . PTSD (post-traumatic stress disorder)    Past Surgical History  Procedure Laterality Date  . Cholecystectomy    . Tonsillectomy    . Tubal ligation     History reviewed. No pertinent family history. History  Substance Use Topics  . Smoking status: Former Games developer  . Smokeless tobacco: Never Used  . Alcohol Use: No   OB History   Grav Para Term Preterm Abortions TAB SAB Ect Mult Living                 Review of Systems   Review of Systems  Gen: no weight loss, fevers, chills, night sweats  Eyes: no occular draining, occular pain,  No visual changes  Nose: no epistaxis or rhinorrhea  Mouth: no dental pain, no sore throat  Neck: no neck pain  Lungs: No hemoptysis. No wheezing or coughing CV:  No palpitations, dependent edema or orthopnea. No chest pain Abd: no diarrhea. + nausea or vomiting, + abdominal pain  GU: no dysuria or gross hematuria  MSK:  No muscle weakness, No muscular pain Neuro: no headache, no  focal neurologic deficits  Skin: no rash , no wounds Psyche: no complaints of depression or anxiety    Allergies  Review of patient's allergies indicates no known allergies.  Home Medications   Prior to Admission medications   Medication Sig Start Date End Date Taking? Authorizing Provider  FLUoxetine (PROZAC) 10 MG capsule Take 10 mg by mouth daily.   Yes Historical Provider, MD  ALBUTEROL IN Inhale into the lungs.    Historical Provider, MD  cephALEXin (KEFLEX) 500 MG capsule Take 1 capsule (500 mg total) by mouth 4 (four) times daily. 03/03/14   Teressa Lower, NP  clindamycin (CLEOCIN) 150 MG capsule Take 3 capsules (450 mg total) by mouth 3 (three) times daily. 05/19/14   Toy Cookey, MD  doxycycline (VIBRAMYCIN) 100 MG capsule Take 1 capsule (100 mg total) by mouth 2 (two) times daily. 09/19/14   Shamir Tuzzolino Irine Seal, PA-C  fluconazole (DIFLUCAN) 150 MG tablet Take 1 tablet (150 mg total) by mouth once. 05/19/14   Doug Sou, MD  GABAPENTIN, PHN, PO Take by mouth.    Historical Provider, MD  hydrOXYzine (ATARAX/VISTARIL) 25 MG tablet Take 1 tablet (25 mg total) by mouth every 6 (six) hours as needed for anxiety. 01/15/13   Himabindu Ravi, MD  ibuprofen (ADVIL,MOTRIN) 600 MG tablet Take 1 tablet (600 mg total) by mouth every 6 (six) hours as needed. 05/19/14   Toy Cookey, MD  omeprazole (PRILOSEC) 20 MG capsule Take 1 capsule (20 mg total) by mouth daily. 09/19/14   Oracio Galen Irine SealG Lafe Clerk, PA-C  pantoprazole (PROTONIX) 20 MG tablet Take 1 tablet (20 mg total) by mouth daily. 09/19/14   Gayleen Sholtz Irine SealG Cathline Dowen, PA-C  QUEtiapine Fumarate (SEROQUEL PO) Take by mouth.    Historical Provider, MD  risperiDONE (RISPERDAL M-TABS) 2 MG disintegrating tablet Take 1 tablet (2 mg total) by mouth at bedtime. For thoughts/mood 01/15/13   Patrick NorthHimabindu Ravi, MD  traZODone (DESYREL) 100 MG tablet Take 1 tablet (100 mg total) by mouth at bedtime. For sleep 01/15/13   Himabindu Daleen Boavi, MD  venlafaxine (EFFEXOR) 37.5 MG  tablet Take 1 tablet (37.5 mg total) by mouth daily. For anxiety/depression 01/15/13   Himabindu Ravi, MD   BP 103/50  Pulse 79  Temp(Src) 98.1 F (36.7 C) (Oral)  Resp 16  Ht 5\' 4"  (1.626 m)  Wt 205 lb (92.987 kg)  BMI 35.17 kg/m2  SpO2 97%  LMP 09/16/2014 Physical Exam  Nursing note and vitals reviewed. Constitutional: She appears well-developed and well-nourished. No distress.  HENT:  Head: Normocephalic and atraumatic.  Eyes: Pupils are equal, round, and reactive to light.  Neck: Normal range of motion. Neck supple.  Cardiovascular: Normal rate and regular rhythm.   Pulmonary/Chest: Effort normal.  Abdominal: Soft. Bowel sounds are normal. She exhibits no distension and no fluid wave. There is no tenderness. There is no rigidity, no rebound, no guarding and no CVA tenderness.  Neurological: She is alert.  Skin: Skin is warm and dry.    ED Course  Procedures (including critical care time) Labs Review Labs Reviewed  URINALYSIS, ROUTINE W REFLEX MICROSCOPIC - Abnormal; Notable for the following:    APPearance CLOUDY (*)    Hgb urine dipstick MODERATE (*)    All other components within normal limits  COMPREHENSIVE METABOLIC PANEL - Abnormal; Notable for the following:    Glucose, Bld 105 (*)    GFR calc non Af Amer 80 (*)    All other components within normal limits  CBC WITH DIFFERENTIAL - Abnormal; Notable for the following:    WBC 12.9 (*)    Neutro Abs 9.2 (*)    Monocytes Absolute 1.2 (*)    All other components within normal limits  URINE MICROSCOPIC-ADD ON - Abnormal; Notable for the following:    Squamous Epithelial / LPF FEW (*)    Bacteria, UA MANY (*)    All other components within normal limits  LIPASE, BLOOD    Imaging Review No results found.   EKG Interpretation None      MDM   Final diagnoses:  Ingrown toenail  Acute gastric ulcer    Patients labwork is unremarkable. She has multiple complaints; ingrown toenail, abdomen hurting,  headache, leg pain. She declines more medication than what was given because of her hx of substance abuse.   Medications  sodium chloride 0.9 % bolus 1,000 mL (0 mLs Intravenous Stopped 09/19/14 1130)  ondansetron (ZOFRAN) injection 4 mg (4 mg Intravenous Given 09/19/14 0922)  acetaminophen (TYLENOL) tablet 650 mg (650 mg Oral Given 09/19/14 0936)  gi cocktail (Maalox,Lidocaine,Donnatal) (30 mLs Oral Given 09/19/14 1026)   She is currently menstruating which is the reason for hemoglobin in her urine, she had many bacteria but only 0-2 WBC. She does endorse some relief with the GI-cocktail. She no longer has a gallbladder and says that NSAIDs irritate her stomach, she has been taking aleve, symptoms appear to be consistent with gastritis  or gastric ulcer. Will start her on protonix and omeprazole.   38 y.o.Fredderick PhenixBrandy F Hood's evaluation in the Emergency Department is complete. It has been determined that no acute conditions requiring further emergency intervention are present at this time. The patient/guardian have been advised of the diagnosis and plan. We have discussed signs and symptoms that warrant return to the ED, such as changes or worsening in symptoms.  Vital signs are stable at discharge. Filed Vitals:   09/19/14 1137  BP: 103/50  Pulse: 79  Temp: 98.1 F (36.7 C)  Resp: 16    Patient/guardian has voiced understanding and agreed to follow-up with the PCP or specialist.      Dorthula Matasiffany G Senta Kantor, PA-C 09/19/14 1534

## 2014-09-19 NOTE — ED Provider Notes (Signed)
Medical screening examination/treatment/procedure(s) were performed by non-physician practitioner and as supervising physician I was immediately available for consultation/collaboration.   EKG Interpretation None        Afsheen Antony, MD 09/19/14 1615 

## 2014-11-02 ENCOUNTER — Emergency Department (HOSPITAL_COMMUNITY)
Admission: EM | Admit: 2014-11-02 | Discharge: 2014-11-02 | Disposition: A | Discharge status: Home / Self Care | Payer: Self-pay | Attending: Emergency Medicine | Admitting: Emergency Medicine

## 2014-11-02 ENCOUNTER — Emergency Department (HOSPITAL_COMMUNITY): Payer: 59

## 2014-11-02 ENCOUNTER — Encounter (HOSPITAL_COMMUNITY): Payer: Self-pay | Admitting: Family Medicine

## 2014-11-02 DIAGNOSIS — R110 Nausea: Secondary | ICD-10-CM | POA: Insufficient documentation

## 2014-11-02 DIAGNOSIS — Z791 Long term (current) use of non-steroidal anti-inflammatories (NSAID): Secondary | ICD-10-CM | POA: Insufficient documentation

## 2014-11-02 DIAGNOSIS — Z9851 Tubal ligation status: Secondary | ICD-10-CM | POA: Insufficient documentation

## 2014-11-02 DIAGNOSIS — F419 Anxiety disorder, unspecified: Secondary | ICD-10-CM | POA: Insufficient documentation

## 2014-11-02 DIAGNOSIS — R1012 Left upper quadrant pain: Secondary | ICD-10-CM | POA: Insufficient documentation

## 2014-11-02 DIAGNOSIS — R634 Abnormal weight loss: Secondary | ICD-10-CM | POA: Insufficient documentation

## 2014-11-02 DIAGNOSIS — R1013 Epigastric pain: Secondary | ICD-10-CM | POA: Insufficient documentation

## 2014-11-02 DIAGNOSIS — R1010 Upper abdominal pain, unspecified: Secondary | ICD-10-CM

## 2014-11-02 DIAGNOSIS — F4310 Post-traumatic stress disorder, unspecified: Secondary | ICD-10-CM | POA: Insufficient documentation

## 2014-11-02 DIAGNOSIS — Z87891 Personal history of nicotine dependence: Secondary | ICD-10-CM | POA: Insufficient documentation

## 2014-11-02 DIAGNOSIS — Z79899 Other long term (current) drug therapy: Secondary | ICD-10-CM | POA: Insufficient documentation

## 2014-11-02 DIAGNOSIS — Z3202 Encounter for pregnancy test, result negative: Secondary | ICD-10-CM | POA: Insufficient documentation

## 2014-11-02 DIAGNOSIS — Z9049 Acquired absence of other specified parts of digestive tract: Secondary | ICD-10-CM | POA: Insufficient documentation

## 2014-11-02 DIAGNOSIS — F314 Bipolar disorder, current episode depressed, severe, without psychotic features: Secondary | ICD-10-CM | POA: Insufficient documentation

## 2014-11-02 LAB — URINALYSIS, ROUTINE W REFLEX MICROSCOPIC
Bilirubin Urine: NEGATIVE
GLUCOSE, UA: NEGATIVE mg/dL
HGB URINE DIPSTICK: NEGATIVE
Ketones, ur: NEGATIVE mg/dL
Leukocytes, UA: NEGATIVE
Nitrite: NEGATIVE
PROTEIN: NEGATIVE mg/dL
Specific Gravity, Urine: 1.018 (ref 1.005–1.030)
Urobilinogen, UA: 0.2 mg/dL (ref 0.0–1.0)
pH: 6.5 (ref 5.0–8.0)

## 2014-11-02 LAB — CBC WITH DIFFERENTIAL/PLATELET
Basophils Absolute: 0 10*3/uL (ref 0.0–0.1)
Basophils Relative: 0 % (ref 0–1)
Eosinophils Absolute: 0.4 10*3/uL (ref 0.0–0.7)
Eosinophils Relative: 4 % (ref 0–5)
HCT: 41 % (ref 36.0–46.0)
HEMOGLOBIN: 13.8 g/dL (ref 12.0–15.0)
Lymphocytes Relative: 27 % (ref 12–46)
Lymphs Abs: 2.8 10*3/uL (ref 0.7–4.0)
MCH: 29.7 pg (ref 26.0–34.0)
MCHC: 33.7 g/dL (ref 30.0–36.0)
MCV: 88.2 fL (ref 78.0–100.0)
Monocytes Absolute: 0.8 10*3/uL (ref 0.1–1.0)
Monocytes Relative: 8 % (ref 3–12)
Neutro Abs: 6.3 10*3/uL (ref 1.7–7.7)
Neutrophils Relative %: 61 % (ref 43–77)
PLATELETS: 231 10*3/uL (ref 150–400)
RBC: 4.65 MIL/uL (ref 3.87–5.11)
RDW: 12.9 % (ref 11.5–15.5)
WBC: 10.4 10*3/uL (ref 4.0–10.5)

## 2014-11-02 LAB — COMPREHENSIVE METABOLIC PANEL
ALK PHOS: 68 U/L (ref 39–117)
ALT: 19 U/L (ref 0–35)
AST: 19 U/L (ref 0–37)
Albumin: 3.9 g/dL (ref 3.5–5.2)
Anion gap: 14 (ref 5–15)
BILIRUBIN TOTAL: 0.3 mg/dL (ref 0.3–1.2)
BUN: 11 mg/dL (ref 6–23)
CHLORIDE: 105 meq/L (ref 96–112)
CO2: 22 mEq/L (ref 19–32)
Calcium: 9 mg/dL (ref 8.4–10.5)
Creatinine, Ser: 0.78 mg/dL (ref 0.50–1.10)
GLUCOSE: 83 mg/dL (ref 70–99)
POTASSIUM: 4.5 meq/L (ref 3.7–5.3)
SODIUM: 141 meq/L (ref 137–147)
Total Protein: 6.9 g/dL (ref 6.0–8.3)

## 2014-11-02 LAB — LIPASE, BLOOD: Lipase: 26 U/L (ref 11–59)

## 2014-11-02 LAB — POC URINE PREG, ED: Preg Test, Ur: NEGATIVE

## 2014-11-02 MED ORDER — SODIUM CHLORIDE 0.9 % IV BOLUS (SEPSIS)
1000.0000 mL | Freq: Once | INTRAVENOUS | Status: AC
  Administered 2014-11-02: 1000 mL via INTRAVENOUS

## 2014-11-02 MED ORDER — ONDANSETRON HCL 4 MG/2ML IJ SOLN
4.0000 mg | Freq: Once | INTRAMUSCULAR | Status: AC
  Administered 2014-11-02: 4 mg via INTRAVENOUS
  Filled 2014-11-02: qty 2

## 2014-11-02 MED ORDER — ONDANSETRON 4 MG PO TBDP: 4.0000 mg | ORAL_TABLET | Freq: Three times a day (TID) | ORAL | Status: DC | PRN

## 2014-11-02 MED ORDER — FAMOTIDINE 20 MG PO TABS: 20.0000 mg | ORAL_TABLET | Freq: Two times a day (BID) | ORAL | Status: DC

## 2014-11-02 NOTE — ED Notes (Signed)
Per pt sts LUQ pain x 1 month with weight loss and nausea.

## 2014-11-02 NOTE — Discharge Instructions (Signed)
Please follow up with your primary care physician in 1-2 days. If you do not have one [facility]. Please follow up with [facility] to schedule a follow up appointment.  Please take all medications as prescribed. Please read all discharge instructions and return precautions.    Abdominal Pain Many things can cause abdominal pain. Usually, abdominal pain is not caused by a disease and will improve without treatment. It can often be observed and treated at home. Your health care provider will do a physical exam and possibly order blood tests and X-rays to help determine the seriousness of your pain. However, in many cases, more time must pass before a clear cause of the pain can be found. Before that point, your health care provider may not know if you need more testing or further treatment. HOME CARE INSTRUCTIONS  Monitor your abdominal pain for any changes. The following actions may help to alleviate any discomfort you are experiencing:  Only take over-the-counter or prescription medicines as directed by your health care provider.  Do not take laxatives unless directed to do so by your health care provider.  Try a clear liquid diet (broth, tea, or water) as directed by your health care provider. Slowly move to a bland diet as tolerated. SEEK MEDICAL CARE IF:  You have unexplained abdominal pain.  You have abdominal pain associated with nausea or diarrhea.  You have pain when you urinate or have a bowel movement.  You experience abdominal pain that wakes you in the night.  You have abdominal pain that is worsened or improved by eating food.  You have abdominal pain that is worsened with eating fatty foods.  You have a fever. SEEK IMMEDIATE MEDICAL CARE IF:   Your pain does not go away within 2 hours.  You keep throwing up (vomiting).  Your pain is felt only in portions of the abdomen, such as the right side or the left lower  portion of the abdomen.  You pass bloody or black tarry stools. MAKE SURE YOU:  Understand these instructions.   Will watch your condition.   Will get help right away if you are not doing well or get worse.  Document Released: 08/28/2005 Document Revised: 11/23/2013 Document Reviewed: 07/28/2013 [facility] Patient Information 2015 [place], [place]LL[place]. This information is not intended to replace advice given to you by your health care provider. Make sure you discuss any questions you have with your health care provider.

## 2014-11-02 NOTE — ED Provider Notes (Signed)
CSN: [id number]     Arrival date & time 11/02/14  1521 History   First MD Initiated Contact with Patient 11/02/14 1644     Chief Complaint  Patient presents with  . Abdominal Pain     (Consider location/radiation/quality/duration/timing/severity/associated sxs/prior Treatment) HPI Comments: Patient is a [id number] 38 yo F PMHx significant for anixety, depression, history of alcohol and drug abuse, PTSD presenting to the emergency department for one month of epigastric and left upper quadrant abdominal pain. She endorses associated nausea and weight loss without vomiting or diarrhea. No modifying factors identified. She's tried ibuprofen with no improvement. She's not seen her family doctor for this. No abdominal surgical history. LMP 2 weeks ago.   Patient is a 38 y.o. female presenting with abdominal pain.  Abdominal Pain Associated symptoms: nausea   Associated symptoms: no chills, no diarrhea, no fever and no vomiting     Past Medical History  Diagnosis Date  . Anxiety   . Depression   . Alcoholic   . Drug addiction in remission   . Bipolar 1 disorder   . PTSD (post-traumatic stress disorder)    Past Surgical History  Procedure Laterality Date  . Cholecystectomy    . Tonsillectomy    . Tubal ligation     History reviewed. No pertinent family history. History  Substance Use Topics  . Smoking status: Former Games developermoker  . Smokeless tobacco: Never Used  . Alcohol Use: No   OB History    No data available     Review of Systems  Constitutional: Positive for unexpected weight change. Negative for fever and chills.  Gastrointestinal: Positive for nausea and abdominal pain. Negative for vomiting and diarrhea.  All other systems reviewed and are negative.     Allergies  Review of patient's allergies indicates no known allergies.  Home Medications   Prior to Admission medications   Medication Sig Start Date End Date Taking? Authorizing Provider  albuterol (PROVENTIL  HFA;VENTOLIN HFA) 108 (90 BASE) MCG/ACT inhaler Inhale 2 puffs into the lungs every 6 (six) hours as needed for wheezing or shortness of breath.   Yes Historical Provider, MD  FLUoxetine (PROZAC) 10 MG capsule Take 10 mg by mouth daily.   Yes Historical Provider, MD  ibuprofen (ADVIL,MOTRIN) 600 MG tablet Take 1 tablet (600 mg total) by mouth every 6 (six) hours as needed. 05/19/14  Yes Toy CookeyMegan Docherty, MD  omeprazole (PRILOSEC) 20 MG capsule Take 1 capsule (20 mg total) by mouth daily. 09/19/14  Yes Tiffany Irine SealG Greene, PA-C  pantoprazole (PROTONIX) 20 MG tablet Take 1 tablet (20 mg total) by mouth daily. 09/19/14  Yes Tiffany Irine SealG Greene, PA-C  risperiDONE (RISPERDAL M-TABS) 2 MG disintegrating tablet Take 1 tablet (2 mg total) by mouth at bedtime. For thoughts/mood 01/15/13  Yes Himabindu Ravi, MD  traZODone (DESYREL) 100 MG tablet Take 1 tablet (100 mg total) by mouth at bedtime. For sleep 01/15/13  Yes Himabindu Ravi, MD  venlafaxine (EFFEXOR) 37.5 MG tablet Take 1 tablet (37.5 mg total) by mouth daily. For anxiety/depression 01/15/13  Yes Himabindu Ravi, MD  cephALEXin (KEFLEX) 500 MG capsule Take 1 capsule (500 mg total) by mouth 4 (four) times daily. Patient not taking: Reported on 11/02/2014 03/03/14   Teressa LowerVrinda Pickering, NP  clindamycin (CLEOCIN) 150 MG capsule Take 3 capsules (450 mg total) by mouth 3 (three) times daily. Patient not taking: Reported on 11/02/2014 05/19/14   Toy CookeyMegan Docherty, MD  doxycycline (VIBRAMYCIN) 100 MG capsule Take 1 capsule (100 mg total)  by mouth 2 (two) times daily. Patient not taking: Reported on 11/02/2014 09/19/14   Dorthula Matasiffany G Greene, PA-C  famotidine (PEPCID) 20 MG tablet Take 1 tablet (20 mg total) by mouth 2 (two) times daily. 11/02/14   Kacia Halley L Ruven Corradi, PA-C  fluconazole (DIFLUCAN) 150 MG tablet Take 1 tablet (150 mg total) by mouth once. Patient not taking: Reported on 11/02/2014 05/19/14   Doug SouSam Jacubowitz, MD  hydrOXYzine (ATARAX/VISTARIL) 25 MG tablet Take 1 tablet (25  mg total) by mouth every 6 (six) hours as needed for anxiety. Patient not taking: Reported on 11/02/2014 01/15/13   Himabindu Ravi, MD  ondansetron (ZOFRAN ODT) 4 MG disintegrating tablet Take 1 tablet (4 mg total) by mouth every 8 (eight) hours as needed for nausea or vomiting. 11/02/14   Geremy Rister L Shonn Farruggia, PA-C   BP 127/75 mmHg  Pulse 67  Temp(Src) 97.6 F (36.4 C)  Resp 18  Ht 5\' 4"  (1.626 m)  Wt 195 lb (88.451 kg)  BMI 33.46 kg/m2  SpO2 99%  LMP 10/19/2014 Physical Exam  Constitutional: She is oriented to person, place, and time. She appears well-developed and well-nourished. No distress.  HENT:  Head: Normocephalic and atraumatic.  Right Ear: External ear normal.  Left Ear: External ear normal.  Nose: Nose normal.  Mouth/Throat: Oropharynx is clear and moist. No oropharyngeal exudate.  Eyes: Conjunctivae are normal.  Neck: Normal range of motion. Neck supple.  Cardiovascular: Normal rate, regular rhythm and normal heart sounds.   Pulmonary/Chest: Effort normal and breath sounds normal.  Abdominal: Soft. Bowel sounds are normal. She exhibits no distension. There is no tenderness. There is no rebound and no guarding.  Musculoskeletal: Normal range of motion.  Lymphadenopathy:    She has no cervical adenopathy.  Neurological: She is alert and oriented to person, place, and time.  Skin: Skin is warm and dry. She is not diaphoretic.  Psychiatric: She has a normal mood and affect.  Nursing note and vitals reviewed.   ED Course  Procedures (including critical care time) Medications  sodium chloride 0.9 % bolus 1,000 mL (0 mLs Intravenous Stopped 11/02/14 2333)  ondansetron (ZOFRAN) injection 4 mg (4 mg Intravenous Given 11/02/14 1715)    Labs Review Labs Reviewed  CBC WITH DIFFERENTIAL  COMPREHENSIVE METABOLIC PANEL  LIPASE, BLOOD  URINALYSIS, ROUTINE W REFLEX MICROSCOPIC  POC URINE PREG, ED    Imaging Review [place] Abdomen Complete  11/02/2014   CLINICAL DATA:   38 year old female with chronic abdominal pain and nausea. Initial encounter. History of cholecystectomy.  EXAM: ULTRASOUND ABDOMEN COMPLETE  COMPARISON:  None.  FINDINGS: Bowel gas limits evaluation.  Gallbladder: The gallbladder is not visualized compatible cholecystectomy.  Common bile duct: Not well visualized but no evidence of intrahepatic biliary dilatation.  Liver: Diffuse increased echogenicity is noted. The liver is enlarged measuring 19 cm in craniocaudal dimension. No focal abnormalities are noted.  IVC: No abnormality visualized.  Pancreas: Not visualized.  Spleen: Size and appearance within normal limits.  Right Kidney: Length: 10.9 cm. Echogenicity within normal limits. No mass or hydronephrosis visualized.  Left Kidney: Length: 10.9 cm. Echogenicity within normal limits. No mass or hydronephrosis visualized.  Abdominal aorta: No aneurysm visualized.  Other findings: None.  IMPRESSION: No evidence of acute abnormality.  Hepatomegaly with diffuse fatty infiltration of liver. No focal hepatic abnormalities.  Pancreas and CBD not well visualized.  Status post cholecystectomy.   Electronically Signed   By: Laveda AbbeJeff  Hu M.D.   On: 11/02/2014 23:15  EKG Interpretation None      MDM   Final diagnoses:  Pain of upper abdomen  Nausea    Filed Vitals:   11/02/14 2315  BP: 127/75  Pulse: 67  Temp:   Resp:    Afebrile, NAD, non-toxic appearing, AAOx4.  Patient is nontoxic, nonseptic appearing, in no apparent distress.  Patient's pain and other symptoms adequately managed in emergency department.  Fluid bolus given.  Labs, imaging and vitals reviewed.  Patient does not meet the SIRS or Sepsis criteria.  On repeat exam patient does not have a surgical abdomen and there are no peritoneal signs.  No indication of appendicitis, bowel obstruction, bowel perforation, cholecystitis, diverticulitis, PID or ectopic pregnancy.  Patient discharged home with symptomatic treatment and given strict  instructions for follow-up with their primary care physician.  I have also discussed reasons to return immediately to the ER.  Patient expresses understanding and agrees with plan. Patient is stable at time of discharge        Jeannetta Ellis, PA-C 11/02/14 2336  Layla Maw Ward, DO 11/03/14 0110

## 2014-11-03 ENCOUNTER — Telehealth: Payer: Self-pay | Admitting: Family

## 2014-11-03 NOTE — Telephone Encounter (Signed)
Left message for patient to return my call.

## 2014-11-03 NOTE — Telephone Encounter (Signed)
Please arrange an ED follow up appointment.

## 2014-11-05 NOTE — Telephone Encounter (Signed)
Left message for patient to return my call.

## 2014-12-09 ENCOUNTER — Emergency Department (HOSPITAL_COMMUNITY)
Admission: EM | Admit: 2014-12-09 | Discharge: 2014-12-11 | Disposition: A | Discharge status: Other Acute Inpatient Hospital | Payer: 59 | Attending: Emergency Medicine | Admitting: Emergency Medicine

## 2014-12-09 ENCOUNTER — Encounter (HOSPITAL_COMMUNITY): Payer: Self-pay | Admitting: *Deleted

## 2014-12-09 DIAGNOSIS — R440 Auditory hallucinations: Secondary | ICD-10-CM

## 2014-12-09 DIAGNOSIS — Z792 Long term (current) use of antibiotics: Secondary | ICD-10-CM | POA: Insufficient documentation

## 2014-12-09 DIAGNOSIS — Z87891 Personal history of nicotine dependence: Secondary | ICD-10-CM | POA: Diagnosis not present

## 2014-12-09 DIAGNOSIS — F458 Other somatoform disorders: Secondary | ICD-10-CM | POA: Diagnosis not present

## 2014-12-09 DIAGNOSIS — F419 Anxiety disorder, unspecified: Secondary | ICD-10-CM | POA: Insufficient documentation

## 2014-12-09 DIAGNOSIS — F444 Conversion disorder with motor symptom or deficit: Secondary | ICD-10-CM

## 2014-12-09 DIAGNOSIS — F319 Bipolar disorder, unspecified: Secondary | ICD-10-CM | POA: Insufficient documentation

## 2014-12-09 DIAGNOSIS — F29 Unspecified psychosis not due to a substance or known physiological condition: Secondary | ICD-10-CM | POA: Diagnosis not present

## 2014-12-09 DIAGNOSIS — Z3202 Encounter for pregnancy test, result negative: Secondary | ICD-10-CM | POA: Insufficient documentation

## 2014-12-09 DIAGNOSIS — F4310 Post-traumatic stress disorder, unspecified: Secondary | ICD-10-CM | POA: Diagnosis not present

## 2014-12-09 DIAGNOSIS — Z79899 Other long term (current) drug therapy: Secondary | ICD-10-CM | POA: Diagnosis not present

## 2014-12-09 DIAGNOSIS — G40909 Epilepsy, unspecified, not intractable, without status epilepticus: Secondary | ICD-10-CM | POA: Insufficient documentation

## 2014-12-09 DIAGNOSIS — F23 Brief psychotic disorder: Secondary | ICD-10-CM

## 2014-12-09 DIAGNOSIS — R45851 Suicidal ideations: Secondary | ICD-10-CM

## 2014-12-09 DIAGNOSIS — R569 Unspecified convulsions: Secondary | ICD-10-CM | POA: Diagnosis present

## 2014-12-09 HISTORY — DX: Personal history of self-harm: Z91.5

## 2014-12-09 HISTORY — DX: Unspecified convulsions: R56.9

## 2014-12-09 LAB — CBC WITH DIFFERENTIAL/PLATELET
BASOS ABS: 0.1 10*3/uL (ref 0.0–0.1)
BASOS PCT: 1 % (ref 0–1)
EOS PCT: 4 % (ref 0–5)
Eosinophils Absolute: 0.4 10*3/uL (ref 0.0–0.7)
HCT: 41.4 % (ref 36.0–46.0)
HEMOGLOBIN: 13.5 g/dL (ref 12.0–15.0)
LYMPHS ABS: 2.4 10*3/uL (ref 0.7–4.0)
LYMPHS PCT: 25 % (ref 12–46)
MCH: 28.5 pg (ref 26.0–34.0)
MCHC: 32.6 g/dL (ref 30.0–36.0)
MCV: 87.5 fL (ref 78.0–100.0)
MONO ABS: 0.7 10*3/uL (ref 0.1–1.0)
Monocytes Relative: 7 % (ref 3–12)
NEUTROS PCT: 63 % (ref 43–77)
Neutro Abs: 6.1 10*3/uL (ref 1.7–7.7)
Platelets: 273 10*3/uL (ref 150–400)
RBC: 4.73 MIL/uL (ref 3.87–5.11)
RDW: 12.5 % (ref 11.5–15.5)
WBC: 9.6 10*3/uL (ref 4.0–10.5)

## 2014-12-09 LAB — COMPREHENSIVE METABOLIC PANEL
ALK PHOS: 66 U/L (ref 39–117)
ALT: 16 U/L (ref 0–35)
AST: 14 U/L (ref 0–37)
Albumin: 3.5 g/dL (ref 3.5–5.2)
Anion gap: 5 (ref 5–15)
BUN: 6 mg/dL (ref 6–23)
CO2: 27 mmol/L (ref 19–32)
CREATININE: 0.93 mg/dL (ref 0.50–1.10)
Calcium: 8.4 mg/dL (ref 8.4–10.5)
Chloride: 108 mEq/L (ref 96–112)
GFR calc Af Amer: 89 mL/min — ABNORMAL LOW (ref >90)
GFR, EST NON AFRICAN AMERICAN: 76 mL/min — AB (ref >90)
Glucose, Bld: 100 mg/dL — ABNORMAL HIGH (ref 70–99)
POTASSIUM: 3.7 mmol/L (ref 3.5–5.1)
SODIUM: 140 mmol/L (ref 135–145)
Total Bilirubin: 0.3 mg/dL (ref 0.3–1.2)
Total Protein: 6.1 g/dL (ref 6.0–8.3)

## 2014-12-09 LAB — VALPROIC ACID LEVEL: VALPROIC ACID LVL: 46.2 ug/mL — AB (ref 50.0–100.0)

## 2014-12-09 MED ORDER — HYDROXYZINE HCL 50 MG PO TABS
50.0000 mg | ORAL_TABLET | Freq: Three times a day (TID) | ORAL | Status: DC | PRN
  Filled 2014-12-09: qty 2

## 2014-12-09 MED ORDER — IBUPROFEN 400 MG PO TABS: 600.0000 mg | ORAL_TABLET | Freq: Three times a day (TID) | ORAL | Status: DC | PRN

## 2014-12-09 MED ORDER — FLUOXETINE HCL 20 MG PO CAPS
20.0000 mg | ORAL_CAPSULE | Freq: Every day | ORAL | Status: DC
  Administered 2014-12-09 – 2014-12-11 (×3): 20 mg via ORAL
  Filled 2014-12-09 (×3): qty 1

## 2014-12-09 MED ORDER — ALUM & MAG HYDROXIDE-SIMETH 200-200-20 MG/5ML PO SUSP
30.0000 mL | ORAL | Status: DC | PRN
Start: 2014-12-09 — End: 2014-12-11

## 2014-12-09 MED ORDER — GABAPENTIN 300 MG PO CAPS
300.0000 mg | ORAL_CAPSULE | Freq: Three times a day (TID) | ORAL | Status: DC
  Administered 2014-12-09 – 2014-12-11 (×7): 300 mg via ORAL
  Filled 2014-12-09 (×7): qty 1

## 2014-12-09 MED ORDER — QUETIAPINE FUMARATE ER 300 MG PO TB24
600.0000 mg | ORAL_TABLET | Freq: Every day | ORAL | Status: DC
  Administered 2014-12-09 – 2014-12-10 (×2): 600 mg via ORAL
  Filled 2014-12-09 (×2): qty 2

## 2014-12-09 MED ORDER — ALBUTEROL SULFATE HFA 108 (90 BASE) MCG/ACT IN AERS: 2.0000 | INHALATION_SPRAY | Freq: Four times a day (QID) | RESPIRATORY_TRACT | Status: DC | PRN

## 2014-12-09 MED ORDER — ACETAMINOPHEN 325 MG PO TABS: 650.0000 mg | ORAL_TABLET | ORAL | Status: DC | PRN

## 2014-12-09 MED ORDER — DIVALPROEX SODIUM 250 MG PO DR TAB
250.0000 mg | DELAYED_RELEASE_TABLET | Freq: Two times a day (BID) | ORAL | Status: DC
  Administered 2014-12-09 – 2014-12-11 (×5): 250 mg via ORAL
  Filled 2014-12-09 (×6): qty 1

## 2014-12-09 NOTE — ED Notes (Signed)
Spoke with TSS on phone.

## 2014-12-09 NOTE — ED Notes (Signed)
Patient states she has had seizures x 4.  [facility] Tuesday and was given depakote  Patient states she continues to have seizures  She is dizzy, blurred vision, words are slurred, and she feels like she is foggy.  Patient [facility] and was told to come to ED.  Patient last seizure reported to be yesterday.  Patient boyfriend feels that she is having seizures at night as well.  Patient states her seizures, she is semi awake,  She can see and hear but can't respond
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
## 2014-12-09 NOTE — ED Provider Notes (Signed)
CSN: [id number]     Arrival date & time 12/09/14  1033 History   First MD Initiated Contact with Patient 12/09/14 1132     Chief Complaint  Patient presents with  . Seizures     (Consider location/radiation/quality/duration/timing/severity/associated sxs/prior Treatment) HPI The [facility] was [facility] and referred to the emergency department for evaluation. The patient and her mother describe one of the main symptoms of concern as episodes of tremoring and seizure-like activity that the patient is sometimes aware of and other times not really aware of. This has been going on for a couple months. The patient's mother's concern is that this may be due to either her Seroquel and or Prozac. Doses were increased about 2 months ago right around the time that the symptoms became more pronounced. The patient reports her concern is that she is having escalating problems with hearing voices telling her to do negative things. She reports the voices are telling her to harm people that she loves and cares about and then at other times telling her ways to harm or kill herself. The patient reports she is concerned about changing or decreasing her medications because 2 months ago when the medications were increased it was because she was extremely decompensated with her psychiatric problems. She indicates that at this time her symptoms are only controlled well enough to keep her from acting on the voices she hears but they are still there. The seizure activity is variable in nature. Report is that during the night the patient's boyfriend has observed her to be tremoring or shaking and she will sit up in the bed and continue to do that. Other times when someone is talking to her she reports that she can hear them talking to her but she can't respond to them. Most of these episodes as described per tray the patient as having postural tone present and awake mental status but with activity such as tremoring or  vague staring or not responding verbally. The patient does not endorse recent illness. She is denying that she's been febrile or feeling sick. She does however report sometimes she just feels like she can't remember things in her mind is "foggy". Past Medical History  Diagnosis Date  . Anxiety   . Depression   . Alcoholic   . Drug addiction in remission   . Bipolar 1 disorder   . PTSD (post-traumatic stress disorder)   . Seizures    Past Surgical History  Procedure Laterality Date  . Cholecystectomy    . Tonsillectomy    . Tubal ligation     No family history on file. History  Substance Use Topics  . Smoking status: Former Games developermoker  . Smokeless tobacco: Never Used  . Alcohol Use: No   OB History    No data available     Review of Systems 10 Systems reviewed and are negative for acute change except as noted in the HPI.    Allergies  Morphine and related  Home Medications   Prior to Admission medications   Medication Sig Start Date End Date Taking? Authorizing Provider  albuterol (PROVENTIL HFA;VENTOLIN HFA) 108 (90 BASE) MCG/ACT inhaler Inhale 2 puffs into the lungs every 6 (six) hours as needed for wheezing or shortness of breath.   Yes Historical Provider, MD  divalproex (DEPAKOTE) 250 MG DR tablet Take 250 mg by mouth 2 (two) times daily.   Yes Historical Provider, MD  FLUoxetine (PROZAC) 10 MG capsule Take 20 mg by mouth  daily.    Yes Historical Provider, MD  gabapentin (NEURONTIN) 300 MG capsule Take 300-600 mg by mouth 3 (three) times daily. Take 1 capsules at morning and noon, then 2 capsules at bedtime   Yes Historical Provider, MD  hydrOXYzine (VISTARIL) 50 MG capsule Take 50-100 mg by mouth 3 (three) times daily as needed for anxiety.   Yes Historical Provider, MD  QUEtiapine (SEROQUEL XR) 300 MG 24 hr tablet Take 600 mg by mouth at bedtime.   Yes Historical Provider, MD  cephALEXin (KEFLEX) 500 MG capsule Take 1 capsule (500 mg total) by mouth 4 (four) times  daily. Patient not taking: Reported on 11/02/2014 03/03/14   Teressa Lower, NP  clindamycin (CLEOCIN) 150 MG capsule Take 3 capsules (450 mg total) by mouth 3 (three) times daily. Patient not taking: Reported on 11/02/2014 05/19/14   Toy Cookey, MD  doxycycline (VIBRAMYCIN) 100 MG capsule Take 1 capsule (100 mg total) by mouth 2 (two) times daily. Patient not taking: Reported on 11/02/2014 09/19/14   Dorthula Matas, PA-C  famotidine (PEPCID) 20 MG tablet Take 1 tablet (20 mg total) by mouth 2 (two) times daily. Patient not taking: Reported on 12/09/2014 11/02/14   Victorino Dike L Piepenbrink, PA-C  fluconazole (DIFLUCAN) 150 MG tablet Take 1 tablet (150 mg total) by mouth once. Patient not taking: Reported on 11/02/2014 05/19/14   Doug Sou, MD  GABAPENTIN, ONCE-DAILY, PO Take 1-2 capsules by mouth 3 (three) times daily.    Historical Provider, MD  hydrOXYzine (ATARAX/VISTARIL) 25 MG tablet Take 1 tablet (25 mg total) by mouth every 6 (six) hours as needed for anxiety. Patient not taking: Reported on 11/02/2014 01/15/13   Patrick North, MD  ibuprofen (ADVIL,MOTRIN) 600 MG tablet Take 1 tablet (600 mg total) by mouth every 6 (six) hours as needed. Patient not taking: Reported on 12/09/2014 05/19/14   Toy Cookey, MD  omeprazole (PRILOSEC) 20 MG capsule Take 1 capsule (20 mg total) by mouth daily. Patient not taking: Reported on 12/09/2014 09/19/14   Dorthula Matas, PA-C  ondansetron (ZOFRAN ODT) 4 MG disintegrating tablet Take 1 tablet (4 mg total) by mouth every 8 (eight) hours as needed for nausea or vomiting. Patient not taking: Reported on 12/09/2014 11/02/14   Victorino Dike L Piepenbrink, PA-C  pantoprazole (PROTONIX) 20 MG tablet Take 1 tablet (20 mg total) by mouth daily. Patient not taking: Reported on 12/09/2014 09/19/14   Dorthula Matas, PA-C  risperiDONE (RISPERDAL M-TABS) 2 MG disintegrating tablet Take 1 tablet (2 mg total) by mouth at bedtime. For thoughts/mood Patient not taking: Reported on  12/09/2014 01/15/13   Patrick North, MD  traZODone (DESYREL) 100 MG tablet Take 1 tablet (100 mg total) by mouth at bedtime. For sleep Patient not taking: Reported on 12/09/2014 01/15/13   Patrick North, MD  venlafaxine (EFFEXOR) 37.5 MG tablet Take 1 tablet (37.5 mg total) by mouth daily. For anxiety/depression Patient not taking: Reported on 12/09/2014 01/15/13   Himabindu Ravi, MD   BP 131/86 mmHg  Pulse 88  Temp(Src) 97.8 F (36.6 C) (Oral)  Resp 22  Ht  (1.6 m)  Wt 190 lb (86.183 kg)  BMI 33.67 kg/m2  SpO2 98% Physical Exam  Constitutional: She is oriented to person, place, and time. She appears well-developed and well-nourished.  HENT:  Head: Normocephalic and atraumatic.  Eyes: EOM are normal. Pupils are equal, round, and reactive to light.  Neck: Neck supple.  Cardiovascular: Normal rate, regular rhythm, normal heart sounds and intact distal  pulses.   Pulmonary/Chest: Effort normal and breath sounds normal.  Abdominal: Soft. Bowel sounds are normal. She exhibits no distension. There is no tenderness.  Musculoskeletal: Normal range of motion. She exhibits no edema.  Neurological: She is alert and oriented to person, place, and time. She has normal strength. Coordination normal. GCS eye subscore is 4. GCS verbal subscore is 5. GCS motor subscore is 6.  The patient is alert and appropriate. Her cognition is normal. Her recall and speech are intact. She is exhibiting insight into her situation. She has normal cerebellar examination with rapid coordinated movements and strength testing. There is no cogwheeling or hyperreflexia present.  Skin: Skin is warm, dry and intact.  Psychiatric: She has a normal mood and affect.    ED Course  Procedures (including critical care time) Labs Review Labs Reviewed  VALPROIC ACID LEVEL - Abnormal; Notable for the following:    Valproic Acid Lvl 46.2 (*)    All other components within normal limits  COMPREHENSIVE METABOLIC PANEL - Abnormal;  Notable for the following:    Glucose, Bld 100 (*)    GFR calc non Af Amer 76 (*)    GFR calc Af Amer 89 (*)    All other components within normal limits  CBC WITH DIFFERENTIAL    Imaging Review No results found.   EKG Interpretation   Date/Time:  Friday December 09 2014 10:54:49 EST Ventricular Rate:  83 PR Interval:  154 QRS Duration: 86 QT Interval:  378 QTC Calculation: 444 R Axis:   77 Text Interpretation:  Normal sinus rhythm Normal ECG AGREE Confirmed by  Donnald Garre, MD, Lebron Conners [id number]) on 12/09/2014 12:55:39 PM      MDM   Final diagnoses:  Continuous auditory hallucinations  Psychogenic movement disorder   My physical examination the patient is well in appearance. She has a normal neurologic examination. Cognitively the patient is intact and appropriate. The described seizure-like activity is atypical for true seizure disorder. As described the patient often is maintaining postural tone and she is aware of her surroundings. At this point I have a higher suspicion for either adverse reaction of medications or psychogenic expression. After discussing this with the patient there are significant concerns regarding decreasing her antipsychotic medication and antidepressant given she still feels very symptomatic with auditory hallucinations suggesting both harm to herself and others that she cares about. At this point I feel the patient does need psychiatric evaluation to determine if medication adjustment is appropriate. I do feel the patient is safe for outpatient neurology workup to further explore the suggestion of possible seizure disorder. The patient did already have a CT head that reportedly is -3 days ago at [facility].    Arby Barrette, MD 12/09/14 [phone]

## 2014-12-09 NOTE — ED Notes (Signed)
Lunch tray ordered 

## 2014-12-09 NOTE — ED Notes (Signed)
Dizziness, blurred vision, balance concerns, eyes wonder off. Pt reports hearing voices at times telling her things like, "just drive off the bridge." Pt denies current SI or HI. [facility] patient with poor continuity of care. Ran out of medications.   [facility]/5 for the same seizure complaints. CT Head- Neg.

## 2014-12-09 NOTE — Progress Notes (Signed)
Pt referral faxed to the following facilities:  [facility]  Will continue to seek placement.  Chad CordialLauren Carter, LCSWA 12/09/2014 3:22 PM

## 2014-12-10 ENCOUNTER — Encounter (HOSPITAL_COMMUNITY): Payer: Self-pay | Admitting: *Deleted

## 2014-12-10 LAB — PREGNANCY, URINE: Preg Test, Ur: NEGATIVE

## 2014-12-10 LAB — RAPID URINE DRUG SCREEN, HOSP PERFORMED
AMPHETAMINES: NOT DETECTED
Barbiturates: NOT DETECTED
Benzodiazepines: NOT DETECTED
Cocaine: NOT DETECTED
OPIATES: NOT DETECTED
Tetrahydrocannabinol: NOT DETECTED

## 2014-12-10 LAB — VALPROIC ACID LEVEL: Valproic Acid Lvl: 35.8 ug/mL — ABNORMAL LOW (ref 50.0–100.0)

## 2014-12-10 NOTE — Progress Notes (Signed)
CSW received [facility] [place] [facility], patient will be accepted and requested clinicals.  [facility].  Will alert nursing staff.  [facility] Tykesha Konicki Macy MisLCSW,LCAS [place] ED CSW [phone]

## 2014-12-10 NOTE — ED Notes (Signed)
Pt given ice water. Pt denies any needs at this time. Appears pleasant and speaking to sitter

## 2014-12-10 NOTE — BH Assessment (Addendum)
Loretta Hood, [facility], confirms adult unit is currently at capacity. Contacted the following facilities for placement:  BED AVAILABLE, FAXED CLINICAL INFORMATION: [facility], per [name], per [name]  INFORMATION FAXED, PT UNDER REVIEW: [name]  AT CAPACITY: [name], per [facility], per [facility], per [facility], per [facility], per [facility], per [name], per [facility], per [facility], per [facility], per [facility], per [facility], per [facility], per [facility], per Mart PiggsShannon   [name], [facility], [facility] Triage Specialist [phone]

## 2014-12-10 NOTE — Progress Notes (Addendum)
CSW faxed referrals to the following facilities:  Facilities with available beds:  [facility] per [place] per Epimenio SarinPhoebe [facility] per Katie  Facilities Denied:  [facility] per Dannielle Huhanny due to acuity. [facility] per Noreene LarssonJill at capacity [facility] per [place] at capacity [facility] per [place] at [employer]Capacity [facility] per [place] at [employer]TT [employer]Capacity [place] per McSwainristen at ITT IndustriesCapacity SHR at Capacity   CSW will continue to assist with placement.  [facility] Snyder Colavito Macy MisLCSW,LCAS [place] ED CSW [phone]

## 2014-12-10 NOTE — ED Provider Notes (Signed)
[facility] Riley [facility]ouglas at [facility] re[facility] pt.  She felt that admission there was reasonable.  She requested fax of information.  Mirian MoMatthew Xyon Lukasik, MD 12/10/14 [phone]

## 2014-12-10 NOTE — BH Assessment (Signed)
[facility] and spoke to [place] who said Pt is still being considered for acceptance and a final decision has not been reached. She will call TTS when the decision is finalized.  Harlin RainFord Ellis Ria CommentWarrick Jr, LPC, [facility] Triage Specialist [phone]

## 2014-12-11 NOTE — ED Notes (Signed)
Spoke with Therapist, occupationalmagistrate office and officer to come serve IVC paperwork

## 2014-12-11 NOTE — BH Assessment (Signed)
Received call from [facility] saying Pt has been declined.  Loretta Hood Loretta Hood, LPC, [facility] Triage Specialist [phone]

## 2014-12-11 NOTE — BH Assessment (Signed)
[facility] per [place] due to "not being medically appropriate" for their program.    Clista BernhardtNancy Amear Strojny, [facility] Triage Specialist 12/11/2014 3:52 AM

## 2014-12-11 NOTE — ED Notes (Signed)
Meal tray ordered 

## 2014-12-11 NOTE — ED Notes (Signed)
Sheriff dept called. States patient should be arriving around 2030

## 2014-12-11 NOTE — ED Notes (Signed)
Loretta Hood [facility] declined patient for placement; [name] at [facility] aware

## 2014-12-11 NOTE — ED Notes (Signed)
[facility] with [facility] at [facility]. [facility].

## 2014-12-11 NOTE — ED Notes (Signed)
Pt. Left with all belongings and left in police custody

## 2014-12-11 NOTE — Progress Notes (Addendum)
CSW [facility] to check on status of patient transfer.  Patsy confirmed with the unit Social Worker they are waiting on a bed assignment and will call as soon as bed is available.  [facility] called to confirm patient would be accepted by Dr. Mariea ClontsKaren Green and her bed number would be [id number].  Floor phone number is [phone].   [facility] Lakeya Mulka Macy MisLCSW,LCAS [place] ED CSW [phone]

## 2014-12-11 NOTE — Discharge Instructions (Signed)
[facility] to [facility].  °

## 2014-12-11 NOTE — ED Provider Notes (Signed)
Psych team indicates pt accepted to [facility], Dr Mariea ClontsKaren Green, bed ready.  Pt is awake and alert. Nad.  Filed Vitals:   12/11/14 0617  BP: 117/65  Pulse: 62  Temp: 98.3 F (36.8 C)  Resp: 18   Pt appears stable for transfer.      Suzi RootsKevin E Zahli Vetsch, MD 12/11/14 [phone]

## 2015-01-16 ENCOUNTER — Ambulatory Visit: Payer: Self-pay

## 2015-02-16 ENCOUNTER — Other Ambulatory Visit: Payer: Self-pay | Admitting: Obstetrics and Gynecology

## 2015-02-16 DIAGNOSIS — N63 Unspecified lump in unspecified breast: Secondary | ICD-10-CM

## 2015-02-20 ENCOUNTER — Ambulatory Visit
Admission: RE | Admit: 2015-02-20 | Discharge: 2015-02-20 | Disposition: A | Discharge status: Home / Self Care | Payer: 59 | Source: Ambulatory Visit | Attending: Obstetrics and Gynecology | Admitting: Obstetrics and Gynecology

## 2015-02-20 DIAGNOSIS — N63 Unspecified lump in unspecified breast: Secondary | ICD-10-CM

## 2015-06-01 ENCOUNTER — Emergency Department (HOSPITAL_COMMUNITY)
Admission: EM | Admit: 2015-06-01 | Discharge: 2015-06-02 | Disposition: A | Discharge status: Home / Self Care | Payer: 59 | Attending: Emergency Medicine | Admitting: Emergency Medicine

## 2015-06-01 ENCOUNTER — Encounter (HOSPITAL_COMMUNITY): Payer: Self-pay | Admitting: *Deleted

## 2015-06-01 DIAGNOSIS — Z79899 Other long term (current) drug therapy: Secondary | ICD-10-CM | POA: Diagnosis not present

## 2015-06-01 DIAGNOSIS — F419 Anxiety disorder, unspecified: Secondary | ICD-10-CM | POA: Diagnosis not present

## 2015-06-01 DIAGNOSIS — F4310 Post-traumatic stress disorder, unspecified: Secondary | ICD-10-CM | POA: Insufficient documentation

## 2015-06-01 DIAGNOSIS — Z87891 Personal history of nicotine dependence: Secondary | ICD-10-CM | POA: Insufficient documentation

## 2015-06-01 DIAGNOSIS — F3160 Bipolar disorder, current episode mixed, unspecified: Secondary | ICD-10-CM | POA: Diagnosis not present

## 2015-06-01 DIAGNOSIS — Z3202 Encounter for pregnancy test, result negative: Secondary | ICD-10-CM | POA: Insufficient documentation

## 2015-06-01 DIAGNOSIS — Z915 Personal history of self-harm: Secondary | ICD-10-CM | POA: Diagnosis not present

## 2015-06-01 DIAGNOSIS — Z8669 Personal history of other diseases of the nervous system and sense organs: Secondary | ICD-10-CM | POA: Insufficient documentation

## 2015-06-01 DIAGNOSIS — R45851 Suicidal ideations: Secondary | ICD-10-CM | POA: Diagnosis present

## 2015-06-01 LAB — COMPREHENSIVE METABOLIC PANEL
ALBUMIN: 3.3 g/dL — AB (ref 3.5–5.0)
ALK PHOS: 65 U/L (ref 38–126)
ALT: 36 U/L (ref 14–54)
AST: 29 U/L (ref 15–41)
Anion gap: 6 (ref 5–15)
BUN: 12 mg/dL (ref 6–20)
CALCIUM: 8.5 mg/dL — AB (ref 8.9–10.3)
CHLORIDE: 107 mmol/L (ref 101–111)
CO2: 26 mmol/L (ref 22–32)
CREATININE: 1.13 mg/dL — AB (ref 0.44–1.00)
Glucose, Bld: 121 mg/dL — ABNORMAL HIGH (ref 65–99)
POTASSIUM: 3.9 mmol/L (ref 3.5–5.1)
SODIUM: 139 mmol/L (ref 135–145)
TOTAL PROTEIN: 5.9 g/dL — AB (ref 6.5–8.1)
Total Bilirubin: 0.3 mg/dL (ref 0.3–1.2)

## 2015-06-01 LAB — URINALYSIS, ROUTINE W REFLEX MICROSCOPIC
Bilirubin Urine: NEGATIVE
Glucose, UA: NEGATIVE mg/dL
Hgb urine dipstick: NEGATIVE
Ketones, ur: NEGATIVE mg/dL
LEUKOCYTES UA: NEGATIVE
NITRITE: NEGATIVE
PROTEIN: NEGATIVE mg/dL
Specific Gravity, Urine: 1.026 (ref 1.005–1.030)
UROBILINOGEN UA: 0.2 mg/dL (ref 0.0–1.0)
pH: 6 (ref 5.0–8.0)

## 2015-06-01 LAB — RAPID URINE DRUG SCREEN, HOSP PERFORMED
AMPHETAMINES: NOT DETECTED
Barbiturates: NOT DETECTED
Benzodiazepines: NOT DETECTED
Cocaine: NOT DETECTED
Opiates: NOT DETECTED
TETRAHYDROCANNABINOL: NOT DETECTED

## 2015-06-01 LAB — CBC WITH DIFFERENTIAL/PLATELET
Basophils Absolute: 0 10*3/uL (ref 0.0–0.1)
Basophils Relative: 0 % (ref 0–1)
Eosinophils Absolute: 0.5 10*3/uL (ref 0.0–0.7)
Eosinophils Relative: 4 % (ref 0–5)
HEMATOCRIT: 37.1 % (ref 36.0–46.0)
HEMOGLOBIN: 12.5 g/dL (ref 12.0–15.0)
LYMPHS ABS: 2.5 10*3/uL (ref 0.7–4.0)
Lymphocytes Relative: 22 % (ref 12–46)
MCH: 30 pg (ref 26.0–34.0)
MCHC: 33.7 g/dL (ref 30.0–36.0)
MCV: 89 fL (ref 78.0–100.0)
MONO ABS: 0.8 10*3/uL (ref 0.1–1.0)
MONOS PCT: 7 % (ref 3–12)
NEUTROS PCT: 67 % (ref 43–77)
Neutro Abs: 7.8 10*3/uL — ABNORMAL HIGH (ref 1.7–7.7)
PLATELETS: 252 10*3/uL (ref 150–400)
RBC: 4.17 MIL/uL (ref 3.87–5.11)
RDW: 12.8 % (ref 11.5–15.5)
WBC: 11.6 10*3/uL — AB (ref 4.0–10.5)

## 2015-06-01 LAB — POC URINE PREG, ED: Preg Test, Ur: NEGATIVE

## 2015-06-01 LAB — ETHANOL

## 2015-06-01 MED ORDER — ZOLPIDEM TARTRATE 5 MG PO TABS
5.0000 mg | ORAL_TABLET | Freq: Every evening | ORAL | Status: DC | PRN
  Filled 2015-06-01: qty 1

## 2015-06-01 MED ORDER — PRAZOSIN HCL 2 MG PO CAPS
2.0000 mg | ORAL_CAPSULE | Freq: Every day | ORAL | Status: DC
Start: 2015-06-01 — End: 2015-06-02
  Administered 2015-06-01: 2 mg via ORAL
  Filled 2015-06-01 (×2): qty 1

## 2015-06-01 MED ORDER — LURASIDONE HCL 40 MG PO TABS
60.0000 mg | ORAL_TABLET | Freq: Every day | ORAL | Status: DC
  Administered 2015-06-01: 60 mg via ORAL
  Filled 2015-06-01: qty 2

## 2015-06-01 MED ORDER — HYDROXYZINE HCL 25 MG PO TABS: 50.0000 mg | ORAL_TABLET | Freq: Three times a day (TID) | ORAL | Status: DC | PRN

## 2015-06-01 MED ORDER — HYDROXYZINE PAMOATE 50 MG PO CAPS
50.0000 mg | ORAL_CAPSULE | Freq: Three times a day (TID) | ORAL | Status: DC | PRN
Start: 2015-06-01 — End: 2015-06-01
  Filled 2015-06-01: qty 2

## 2015-06-01 MED ORDER — ALBUTEROL SULFATE HFA 108 (90 BASE) MCG/ACT IN AERS: 2.0000 | INHALATION_SPRAY | Freq: Four times a day (QID) | RESPIRATORY_TRACT | Status: DC | PRN

## 2015-06-01 MED ORDER — ONDANSETRON HCL 4 MG PO TABS: 4.0000 mg | ORAL_TABLET | Freq: Three times a day (TID) | ORAL | Status: DC | PRN

## 2015-06-01 MED ORDER — IBUPROFEN 400 MG PO TABS: 600.0000 mg | ORAL_TABLET | Freq: Three times a day (TID) | ORAL | Status: DC | PRN

## 2015-06-01 MED ORDER — ALUM & MAG HYDROXIDE-SIMETH 200-200-20 MG/5ML PO SUSP
30.0000 mL | ORAL | Status: DC | PRN
  Administered 2015-06-01: 30 mL via ORAL
  Filled 2015-06-01: qty 30

## 2015-06-01 MED ORDER — ACETAMINOPHEN 325 MG PO TABS: 650.0000 mg | ORAL_TABLET | ORAL | Status: DC | PRN

## 2015-06-01 MED ORDER — BUSPIRONE HCL 10 MG PO TABS
10.0000 mg | ORAL_TABLET | Freq: Three times a day (TID) | ORAL | Status: DC
  Administered 2015-06-01 (×2): 10 mg via ORAL
  Filled 2015-06-01 (×2): qty 1

## 2015-06-01 MED ORDER — GABAPENTIN 300 MG PO CAPS
300.0000 mg | ORAL_CAPSULE | Freq: Two times a day (BID) | ORAL | Status: DC
  Administered 2015-06-01 (×2): 300 mg via ORAL
  Filled 2015-06-01 (×2): qty 1

## 2015-06-01 NOTE — ED Notes (Signed)
Pt requesting something to sleep.

## 2015-06-01 NOTE — ED Notes (Addendum)
Spoke with pt about plan to find treatment placement for her.  Pt verbalized understanding.

## 2015-06-01 NOTE — ED Notes (Signed)
Staffing office called for a sitter. 

## 2015-06-01 NOTE — ED Notes (Signed)
BH called and is seeking placement for pt.

## 2015-06-01 NOTE — ED Notes (Signed)
Pt is asleep, will hold sleeping medication.

## 2015-06-01 NOTE — Progress Notes (Addendum)
Patient was referred for IP treatment at: [facility] - per Nicholos JohnsKathleen, ok to fax. [facility] - per intake, ok to fax referral but "there's a few ahead of you". Per [place], refax. Referral refaxed at 11:10pm. [facility] - per intake, fax referral for review. Per Drenda FreezeFran, referral  [facility] - per Darl PikesSusan, fax referral. [facility] - per Zella Ballobin, fax it, may have a bed.  [facility] - per intake, ok to fax referral for wait-list. Per Fayrene FearingJames, check back in am. [facility] - per Morrie SheldonAshley, "Fax it, adult and adolescent beds open." Per Morrie SheldonAshley, referral pending - will be checking pt's insurance in am. [facility] - per Thayer Ohmhris, fax it for review. [facility] - per intake, ok to fax.  At capacity: [facility] and [facility]  Left [facility]icemail at: [facility]   CSW will continue to seek placement.  Loretta Abtsatia Shamarra Hood, LCSWA Disposition staff 06/01/2015 5:01 PM

## 2015-06-01 NOTE — BH Assessment (Signed)
Spoke with Irving BurtonEmily from [facility] who reported that pt has been accepted to the services of Dr.Fleury. Nurse to nurse report # [phone]. Pt can be transported at anytime. Dr. Freida BusmanAllen has been informed of the disposition.

## 2015-06-01 NOTE — ED Provider Notes (Signed)
CSN: [id number]     Arrival date & time 06/01/15  1139 History   First MD Initiated Contact with Patient 06/01/15 1203     Chief Complaint  Patient presents with  . Suicidal     (Consider location/radiation/quality/duration/timing/severity/associated sxs/prior Treatment) The history is provided by the patient.   patient presents with depression. History of bipolar disorder and anxiety. Vesta Mixer has recently changed from Abilify which was working rather well to look to do. This is from 3 weeks ago. States she's had more anxiety since then. She is also having auditory hallucinations telling her to kill herself. She is having some suicidal thoughts. Remote suicide attempt but states she does not she will do a right now. No current substance abuse. She states she is otherwise pretty healthy. No seizures. No confusion. States she thinks she probably needs inpatient treatment.  Past Medical History  Diagnosis Date  . Anxiety   . Depression   . Alcoholic   . Drug addiction in remission   . Bipolar 1 disorder   . PTSD (post-traumatic stress disorder)   . Seizures     started in Dec 2015 - states last seizure 12/08/14 - has not seen neurologist   . H/O suicide attempt 2005    overdose   Past Surgical History  Procedure Laterality Date  . Cholecystectomy    . Tonsillectomy    . Tubal ligation     No family history on file. History  Substance Use Topics  . Smoking status: Former Games developer  . Smokeless tobacco: Never Used  . Alcohol Use: Yes     Comment: drinks beer 3 days/week - last drank 2013   OB History    No data available     Review of Systems  Constitutional: Negative for fever, activity change and appetite change.  Respiratory: Negative for chest tightness and shortness of breath.   Cardiovascular: Negative for chest pain.  Gastrointestinal: Negative for abdominal pain.  Genitourinary: Negative for flank pain.  Musculoskeletal: Negative for back pain and neck stiffness.   Skin: Negative for rash.  Neurological: Negative for weakness and numbness.  Psychiatric/Behavioral: Positive for suicidal ideas and hallucinations. Negative for behavioral problems. The patient is nervous/anxious.       Allergies  Morphine and related and Seroquel  Home Medications   Prior to Admission medications   Medication Sig Start Date End Date Taking? Authorizing Provider  albuterol (PROVENTIL HFA;VENTOLIN HFA) 108 (90 BASE) MCG/ACT inhaler Inhale 2 puffs into the lungs every 6 (six) hours as needed for wheezing or shortness of breath.   Yes Historical Provider, MD  busPIRone (BUSPAR) 10 MG tablet Take 10 mg by mouth 3 (three) times daily.   Yes Historical Provider, MD  doxylamine, Sleep, (UNISOM) 25 MG tablet Take 25 mg by mouth at bedtime as needed for sleep.   Yes Historical Provider, MD  gabapentin (NEURONTIN) 300 MG capsule Take 300-1,200 mg by mouth 2 (two) times daily. Take 2 caps ( ) and  4 caps (1200) at bedtime   Yes Historical Provider, MD  hydrOXYzine (VISTARIL) 50 MG capsule Take 50-100 mg by mouth 3 (three) times daily as needed for anxiety.   Yes Historical Provider, MD  Lurasidone HCl (LATUDA) 60 MG TABS Take 60 mg by mouth daily.   Yes Historical Provider, MD  prazosin (MINIPRESS) 2 MG capsule Take 2 mg by mouth at bedtime.   Yes Historical Provider, MD   BP 122/72 mmHg  Pulse 86  Temp(Src) 98 F (36.7 C) (Oral)  Resp 18  Ht 5\' 4"  (1.626 m)  Wt 212 lb 4.8 oz (96.299 kg)  BMI 36.42 kg/m2  SpO2 96%  LMP 05/08/2015 Physical Exam  Constitutional: She is oriented to person, place, and time. She appears well-developed.  HENT:  Head: Atraumatic.  Eyes: Pupils are equal, round, and reactive to light.  Neck: Neck supple.  Cardiovascular: Normal rate and regular rhythm.   Pulmonary/Chest: Effort normal.  Abdominal: Soft.  Musculoskeletal: Normal range of motion.  Neurological: She is alert and oriented to person, place, and time.  Patient is not  responding to internal stimuli.  Skin: Skin is warm.  Psychiatric: She has a normal mood and affect.    ED Course  Procedures (including critical care time) Labs Review Labs Reviewed  COMPREHENSIVE METABOLIC PANEL - Abnormal; Notable for the following:    Glucose, Bld 121 (*)    Creatinine, Ser 1.13 (*)    Calcium 8.5 (*)    Total Protein 5.9 (*)    Albumin 3.3 (*)    All other components within normal limits  CBC WITH DIFFERENTIAL/PLATELET - Abnormal; Notable for the following:    WBC 11.6 (*)    Neutro Abs 7.8 (*)    All other components within normal limits  URINALYSIS, ROUTINE W REFLEX MICROSCOPIC (NOT AT [facility]) - Abnormal; Notable for the following:    APPearance CLOUDY (*)    All other components within normal limits  ETHANOL  URINE RAPID DRUG SCREEN, HOSP PERFORMED  POC URINE PREG, ED    Imaging Review No results found.   EKG Interpretation None      MDM   Final diagnoses:  Bipolar affective disorder, current episode mixed, without psychotic features, current episode severity unspecified    Patient with depression and suicidal thoughts. Appears to medically cleared. Has had some recent medication adjustment. She is voluntary at this time. To be seen by TTS.    Benjiman CoreNathan Seynabou Fults, MD 06/01/15 [phone]

## 2015-06-01 NOTE — ED Notes (Signed)
Pt family member has pt belongings (okay with pt for family member to take belongings home).

## 2015-06-01 NOTE — ED Notes (Signed)
Pt placed in purple scrubs, belongings collected, wanded by security.
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
## 2015-06-01 NOTE — ED Notes (Signed)
Pt states since she started taking Latuda 3 weeks ago she has been depressed and has been hearing voices telling her to hurt herself.  States she was on Abilify and that it was working, Community education officerinsurance would no longer fund.

## 2015-06-01 NOTE — ED Notes (Signed)
Pharmacy called and stated that they were working on getting pt medication.

## 2015-06-02 ENCOUNTER — Encounter (HOSPITAL_COMMUNITY): Payer: Self-pay

## 2015-06-02 ENCOUNTER — Inpatient Hospital Stay (HOSPITAL_COMMUNITY)
Admission: EM | Admit: 2015-06-02 | Discharge: 2015-06-08 | DRG: 885 | Disposition: A | Discharge status: Home / Self Care | Payer: 59 | Source: Intra-hospital | Attending: Psychiatry | Admitting: Psychiatry

## 2015-06-02 DIAGNOSIS — F1121 Opioid dependence, in remission: Secondary | ICD-10-CM | POA: Diagnosis present

## 2015-06-02 DIAGNOSIS — F1221 Cannabis dependence, in remission: Secondary | ICD-10-CM | POA: Diagnosis present

## 2015-06-02 DIAGNOSIS — Z91410 Personal history of adult physical and sexual abuse: Secondary | ICD-10-CM | POA: Diagnosis not present

## 2015-06-02 DIAGNOSIS — F1490 Cocaine use, unspecified, uncomplicated: Secondary | ICD-10-CM

## 2015-06-02 DIAGNOSIS — F319 Bipolar disorder, unspecified: Secondary | ICD-10-CM | POA: Diagnosis present

## 2015-06-02 DIAGNOSIS — G4700 Insomnia, unspecified: Secondary | ICD-10-CM | POA: Diagnosis present

## 2015-06-02 DIAGNOSIS — F1190 Opioid use, unspecified, uncomplicated: Secondary | ICD-10-CM

## 2015-06-02 DIAGNOSIS — F410 Panic disorder [episodic paroxysmal anxiety] without agoraphobia: Secondary | ICD-10-CM | POA: Diagnosis present

## 2015-06-02 DIAGNOSIS — F4310 Post-traumatic stress disorder, unspecified: Secondary | ICD-10-CM | POA: Diagnosis present

## 2015-06-02 DIAGNOSIS — F315 Bipolar disorder, current episode depressed, severe, with psychotic features: Secondary | ICD-10-CM | POA: Diagnosis present

## 2015-06-02 DIAGNOSIS — F1099 Alcohol use, unspecified with unspecified alcohol-induced disorder: Secondary | ICD-10-CM

## 2015-06-02 DIAGNOSIS — F1290 Cannabis use, unspecified, uncomplicated: Secondary | ICD-10-CM | POA: Diagnosis not present

## 2015-06-02 DIAGNOSIS — Z87891 Personal history of nicotine dependence: Secondary | ICD-10-CM

## 2015-06-02 DIAGNOSIS — R45851 Suicidal ideations: Secondary | ICD-10-CM | POA: Diagnosis present

## 2015-06-02 DIAGNOSIS — F1421 Cocaine dependence, in remission: Secondary | ICD-10-CM | POA: Diagnosis present

## 2015-06-02 DIAGNOSIS — F1021 Alcohol dependence, in remission: Secondary | ICD-10-CM | POA: Diagnosis present

## 2015-06-02 DIAGNOSIS — F411 Generalized anxiety disorder: Secondary | ICD-10-CM | POA: Diagnosis present

## 2015-06-02 MED ORDER — ACETAMINOPHEN 325 MG PO TABS: 650.0000 mg | ORAL_TABLET | Freq: Four times a day (QID) | ORAL | Status: DC | PRN

## 2015-06-02 MED ORDER — SALINE SPRAY 0.65 % NA SOLN
1.0000 | NASAL | Status: DC | PRN
  Administered 2015-06-03 – 2015-06-08 (×4): 1 via NASAL
  Filled 2015-06-02: qty 44

## 2015-06-02 MED ORDER — ALUM & MAG HYDROXIDE-SIMETH 200-200-20 MG/5ML PO SUSP
30.0000 mL | ORAL | Status: DC | PRN
  Administered 2015-06-04: 30 mL via ORAL
  Filled 2015-06-02: qty 30

## 2015-06-02 MED ORDER — DIVALPROEX SODIUM 500 MG PO DR TAB
500.0000 mg | DELAYED_RELEASE_TABLET | Freq: Two times a day (BID) | ORAL | Status: DC
  Administered 2015-06-02 – 2015-06-08 (×13): 500 mg via ORAL
  Filled 2015-06-02: qty 6
  Filled 2015-06-02 (×4): qty 1
  Filled 2015-06-02: qty 6
  Filled 2015-06-02: qty 1
  Filled 2015-06-02: qty 6
  Filled 2015-06-02 (×4): qty 1
  Filled 2015-06-02: qty 6
  Filled 2015-06-02 (×8): qty 1

## 2015-06-02 MED ORDER — ALBUTEROL SULFATE HFA 108 (90 BASE) MCG/ACT IN AERS
2.0000 | INHALATION_SPRAY | Freq: Four times a day (QID) | RESPIRATORY_TRACT | Status: DC | PRN
  Filled 2015-06-02: qty 6.7

## 2015-06-02 MED ORDER — BUSPIRONE HCL 10 MG PO TABS
10.0000 mg | ORAL_TABLET | Freq: Three times a day (TID) | ORAL | Status: DC
  Administered 2015-06-02: 10 mg via ORAL
  Filled 2015-06-02 (×4): qty 1

## 2015-06-02 MED ORDER — PRAZOSIN HCL 2 MG PO CAPS
2.0000 mg | ORAL_CAPSULE | Freq: Every day | ORAL | Status: DC
  Administered 2015-06-02 – 2015-06-07 (×6): 2 mg via ORAL
  Filled 2015-06-02 (×9): qty 1

## 2015-06-02 MED ORDER — MAGNESIUM HYDROXIDE 400 MG/5ML PO SUSP: 30.0000 mL | Freq: Every day | ORAL | Status: DC | PRN

## 2015-06-02 MED ORDER — LURASIDONE HCL 40 MG PO TABS
60.0000 mg | ORAL_TABLET | Freq: Every day | ORAL | Status: DC
  Administered 2015-06-02: 60 mg via ORAL
  Filled 2015-06-02 (×2): qty 2

## 2015-06-02 MED ORDER — BENZTROPINE MESYLATE 0.5 MG PO TABS
0.5000 mg | ORAL_TABLET | Freq: Two times a day (BID) | ORAL | Status: DC
  Administered 2015-06-02 – 2015-06-07 (×10): 0.5 mg via ORAL
  Filled 2015-06-02 (×15): qty 1

## 2015-06-02 MED ORDER — GABAPENTIN 300 MG PO CAPS
600.0000 mg | ORAL_CAPSULE | Freq: Every day | ORAL | Status: DC
  Administered 2015-06-02 – 2015-06-08 (×7): 600 mg via ORAL
  Filled 2015-06-02 (×2): qty 2
  Filled 2015-06-02: qty 6
  Filled 2015-06-02 (×3): qty 2
  Filled 2015-06-02: qty 6
  Filled 2015-06-02 (×4): qty 2

## 2015-06-02 MED ORDER — HALOPERIDOL 5 MG PO TABS
5.0000 mg | ORAL_TABLET | Freq: Two times a day (BID) | ORAL | Status: DC
  Administered 2015-06-02 – 2015-06-08 (×12): 5 mg via ORAL
  Filled 2015-06-02 (×3): qty 1
  Filled 2015-06-02: qty 6
  Filled 2015-06-02 (×4): qty 1
  Filled 2015-06-02: qty 6
  Filled 2015-06-02 (×3): qty 1
  Filled 2015-06-02: qty 6
  Filled 2015-06-02 (×5): qty 1
  Filled 2015-06-02: qty 6
  Filled 2015-06-02 (×2): qty 1

## 2015-06-02 MED ORDER — HYDROXYZINE HCL 50 MG PO TABS
50.0000 mg | ORAL_TABLET | Freq: Three times a day (TID) | ORAL | Status: DC | PRN
  Administered 2015-06-02 – 2015-06-07 (×3): 50 mg via ORAL
  Filled 2015-06-02: qty 2
  Filled 2015-06-02 (×2): qty 1
  Filled 2015-06-02: qty 2
  Filled 2015-06-02: qty 14
  Filled 2015-06-02: qty 1

## 2015-06-02 MED ORDER — DOXEPIN HCL 10 MG PO CAPS
10.0000 mg | ORAL_CAPSULE | Freq: Every evening | ORAL | Status: DC | PRN
  Administered 2015-06-02 – 2015-06-07 (×7): 10 mg via ORAL
  Filled 2015-06-02 (×5): qty 1
  Filled 2015-06-02: qty 6
  Filled 2015-06-02 (×2): qty 1
  Filled 2015-06-02: qty 6
  Filled 2015-06-02 (×7): qty 1
  Filled 2015-06-02 (×2): qty 6

## 2015-06-02 MED ORDER — LURASIDONE HCL 40 MG PO TABS
40.0000 mg | ORAL_TABLET | Freq: Every day | ORAL | Status: AC
Start: 2015-06-03 — End: 2015-06-03
  Administered 2015-06-03: 40 mg via ORAL
  Filled 2015-06-02: qty 1

## 2015-06-02 MED ORDER — GABAPENTIN 400 MG PO CAPS
1200.0000 mg | ORAL_CAPSULE | Freq: Every day | ORAL | Status: DC
  Administered 2015-06-02 – 2015-06-07 (×6): 1200 mg via ORAL
  Filled 2015-06-02: qty 3
  Filled 2015-06-02: qty 9
  Filled 2015-06-02: qty 3
  Filled 2015-06-02: qty 9
  Filled 2015-06-02 (×5): qty 3

## 2015-06-02 NOTE — Tx Team (Signed)
Initial Interdisciplinary Treatment Plan   PATIENT STRESSORS: Medication change or noncompliance Traumatic event   PATIENT STRENGTHS: Ability for insight Average or above average intelligence Communication skills General fund of knowledge Motivation for treatment/growth Supportive family/friends   PROBLEM LIST: Problem List/Patient Goals Date to be addressed Date deferred Reason deferred Estimated date of resolution  Risk for suicide 06/02/15     Depression 06/02/15     Medication management 06/02/15     "want to work on coping skills, past issues, anxiety" 06/02/15                                    DISCHARGE CRITERIA:  Improved stabilization in mood, thinking, and/or behavior Verbal commitment to aftercare and medication compliance  PRELIMINARY DISCHARGE PLAN: Attend aftercare/continuing care group Outpatient therapy  PATIENT/FAMIILY INVOLVEMENT: This treatment plan has been presented to and reviewed with the patient, Loretta Hood.  The patient and family have been given the opportunity to ask questions and make suggestions.  Jacques Navyhillips, Loretta Hood 06/02/2015, 2:42 AM

## 2015-06-02 NOTE — Progress Notes (Signed)
Psychoeducational Group Note  Date:  06/02/2015 Time:  2248  Group Topic/Focus:  Wrap-Up Group:   The focus of this group is to help patients review their daily goal of treatment and discuss progress on daily workbooks.  Participation Level: Did Not Attend  Participation Quality:  Not Applicable  Affect:  Not Applicable  Cognitive:  Not Applicable  Insight:  Not Applicable  Engagement in Group: Not Applicable  Additional Comments:  The patient did not attend group this evening since she was asleep at that time.   Hazle CocaGOODMAN, Lamya Lausch S 06/02/2015, 10:47 PM
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
## 2015-06-02 NOTE — [facility] Group Notes (Signed)
[facility] Note   06/02/2015 11:28 AM  Participation Quality:  In bed asleep.  Did not attend    Hood, [name]

## 2015-06-02 NOTE — Tx Team (Signed)
Interdisciplinary Treatment Plan Update (Adult)  Date:  06/02/2015   Time Reviewed:  8:08 AM   Progress in Treatment: Attending groups: Yes. Participating in groups:  Yes. Taking medication as prescribed:  Yes. Tolerating medication:  Yes. Family/Significant othe contact made:  No Patient understands diagnosis:  Yes  As evidenced by seeking help with getting on effective meds for psychosis/depression Discussing patient identified problems/goals with staff:  Yes, see initial care plan. Medical problems stabilized or resolved:  Yes. Denies suicidal/homicidal ideation: Yes. Issues/concerns per patient self-inventory:  No. Other:  New problem(s) identified:  Discharge Plan or Barriers: return home, follow up outpt  Reason for Continuation of Hospitalization: Depression Hallucinations Medication stabilization  Comments:   Related encounter: [facility]01/[facility]      Pt states since she started taking Latuda 3 weeks ago she has been depressed and has been hearing voices telling her to hurt herself. States she was on Abilify and that it was working, Community education officerinsurance would no longer fund.     Haldol, Depakote trial   Estimated length of stay: 4-5 days  New goal(s):  Review of initial/current patient goals per problem list:     Attendees: Patient:  06/02/2015 8:08 AM   Family:   06/02/2015 8:08 AM   Physician:  Jomarie LongsSaramma Eappen, MD 06/02/2015 8:08 AM   Nursing:   Liborio NixonPatrice White, RN 06/02/2015 8:08 AM   CSW:    Daryel Geraldodney Deola Rewis, LCSW   06/02/2015 8:08 AM   Other:  06/02/2015 8:08 AM   Other:   06/02/2015 8:08 AM   Other:  Onnie BoerJennifer Clark, Nurse CM 06/02/2015 8:08 AM   Other:  Leisa LenzValerie Enoch, [facility] 06/02/2015 8:08 AM   Other:  Tomasita Morrowelora Sutton, P4CC  06/02/2015 8:08 AM   Other:  06/02/2015 8:08 AM   Other:  06/02/2015 8:08 AM   Other:  06/02/2015 8:08 AM   Other:  06/02/2015 8:08 AM   Other:  06/02/2015 8:08 AM   Other:   06/02/2015 8:08 AM    Scribe for  Treatment Team:   Ida RogueNorth, Walter Min B, 06/02/2015 8:08 AM

## 2015-06-02 NOTE — ED Notes (Signed)
Pt accepted to [facility] [place] but needs to have IVC paperwork for transport.  Dr. Freida BusmanAllen will talk to pt regarding IVC, pt is aware.  Upon completion, paperwork needs to be faxed and pt needs to be put on sheriff transport list.
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
## 2015-06-02 NOTE — ED Provider Notes (Signed)
Patient seen per request for IVC commitment to be placed. Patient continues to endorse suicidal ideations with plan to drive herself in traffic. Paperwork filled out for transport  Lorre NickAnthony Seniyah Esker, MD 06/02/15 [phone]

## 2015-06-02 NOTE — Progress Notes (Addendum)
Pt  Talking in sleep loudly then began yelling in her sleep and woke up her roommate , roommate was scared. Roommate requested to leave the door open for the rest of the night.

## 2015-06-02 NOTE — Progress Notes (Signed)
Patient ID: [name], female   DOB: [birth year], 39 y.o.   MRN: [id number] Admission Note:  D:38 yr female who presents VC in no acute distress for the treatment of SI and Depression. Pt appears flat and depressed. Pt was calm and cooperative with admission process. Pt presents with passive SI and contracts for safety upon admission. Pt +ve AH-command.  Pt denies VH . Pt stated she was having SI thoughts , went to her Psychiatrist and he suggested she come to the Hospital. Pt stated she was changed from Abilify to JordanLatuda due to [employer] funding. Thy would not pay for her Abilify anymore. Pt said she started De-compensating since being taken off Abilify.   A:Skin was assessed by female nurse and documented. PT searched and no contraband found, POC and unit policies explained and understanding verbalized. Consents obtained. Food and fluids offered, and  Accepted.  R: Pt had no additional questions or concerns.
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
## 2015-06-02 NOTE — [facility] Group Notes (Signed)
[facility] LCSW Group Therapy 06/02/2015 1:15 PM  Type of Therapy: Group Therapy  Participation Level: Active  Participation Quality: Attentive, Sharing and Supportive  Affect: Reserved, Quiet  Cognitive: Alert and Oriented  Insight: Developing/Improving   Engagement in Therapy: Developing/Improving   Modes of Intervention: Clarification, Confrontation, Discussion, Education, Exploration, Limit-setting, Orientation, Problem-solving, Rapport Building, Dance movement psychotherapisteality Testing, Socialization and Support  Summary of Progress/Problems: The topic for today was feelings about relapse and recovery. Pt discussed what recovery  is to them and identified triggers that they are on the path to relapse. Pt processed their feeling towards relapse and was able to relate to peers. Pt discussed coping skills that can be used for relapse prevention. Described progress in recovery, states her family helped her get into recovery from substance use disorder by providing shelter and encouragement.  Identifies her psychiatrist, MD and family as major supports  Loretta GeneraAnne Madalene Mickler, LCSW Clinical Social Worker

## 2015-06-03 LAB — LIPID PANEL
Cholesterol: 148 mg/dL (ref 0–200)
HDL: 26 mg/dL — ABNORMAL LOW (ref >40)
LDL Cholesterol: 55 mg/dL (ref 0–99)
TRIGLYCERIDES: 334 mg/dL — AB (ref <150)
Total CHOL/HDL Ratio: 5.7 RATIO
VLDL: 67 mg/dL — ABNORMAL HIGH (ref 0–40)

## 2015-06-03 LAB — TSH: TSH: 1.836 u[IU]/mL (ref 0.350–4.500)

## 2015-06-03 NOTE — Progress Notes (Signed)
Psychoeducational Group Note  Date:  06/03/2015 Time:  2133  Group Topic/Focus:  Wrap-Up Group:   The focus of this group is to help patients review their daily goal of treatment and discuss progress on daily workbooks.  Participation Level: Did Not Attend  Participation Quality:  Not Applicable  Affect:  Not Applicable  Cognitive:  Not Applicable  Insight:  Not Applicable  Engagement in Group: Not Applicable  Additional Comments:  The patient did not attend group this evening since she was asleep in her room.   Hazle CocaGOODMAN, Aiden Rao S 06/03/2015, 9:33 PM

## 2015-06-03 NOTE — Plan of Care (Signed)
Problem: Diagnosis: Increased Risk For Suicide Attempt Goal: STG-Patient Will Attend All Groups On The Unit Outcome: Not Progressing Patient did not attend group this evening. She was in her room asleep.

## 2015-06-03 NOTE — Progress Notes (Signed)
Writer has observed patient in her bed asleep for most of the evening. She has been up briefly to take her medications and for snack. She reports that she continues to have auditory hallucinations and contracts for safety. She did not attend group this evening because she was asleep. She denies homicidal ideations. Support and encouragement given, safety maintained on unit with 15 min checks.

## 2015-06-03 NOTE — Progress Notes (Signed)
Psychoeducational Group Note  Date:  07/11/2012 Time: 1015  Group Topic/Focus:  Identifying Needs:   The focus of this group is to help patients identify their personal  Goals as well as identify skills needed to accomplish these goals.  Participation Level:  Patient did not attend  Participation Quality:N/A   Affect: N/A  cognitive:  N/A  Insight:  N/A  Engagement in Group: N/A  Additional Comments:    PDuke RN BC

## 2015-06-03 NOTE — Plan of Care (Signed)
Problem: Diagnosis: Increased Risk For Suicide Attempt Goal: LTG-Patient Will Show Positive Response to Medication LTG (by discharge) : Patient will show positive response to medication and will participate in the development of the discharge plan.  Outcome: Progressing Pt denies SI at this time, she was +ve for SI yesterday  Problem: Alteration in mood; excessive anxiety as evidenced by: Goal: LTG-Patient's behavior demonstrates decreased anxiety Rogue rates her anxiety at a 5 today. Goal is 3 or less on a 10 scale at d/c. Goal not met. R North LCSW 06/02/2015 3:04 PM  Outcome: Progressing Pt did not mention being anxious tonight , or needing anything for her anxiety this evening.
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
## 2015-06-03 NOTE — [facility] Group Notes (Signed)
[facility] LCSW Group Therapy Note  06/03/2015 1:15 PM  Type of Therapy and Topic:  Group Therapy: Avoiding Self-Sabotaging and Enabling Behaviors  Participation Level:  Did Not Attend      Carney Bernatherine C Harrill, LCSW

## 2015-06-03 NOTE — Plan of Care (Signed)
Problem: Diagnosis: Increased Risk For Suicide Attempt Goal: STG-Patient Will Comply With Medication Regime Outcome: Progressing Patient is compliant with scheduled medications.     

## 2015-06-03 NOTE — Progress Notes (Signed)
D) Pt has been in her bed most of the shift except for the groups and meals. Rates her depression at an 8, her hopelessness at an 8 and her anxiety at a 6. Denies SI and HI. States she has no idea why she is so very tired. States she has never taken Haldol before and questioned whether or not that would make her this tired. When up, Patients affect and mood are appropriate. Pt rates her depression at an 8, hopelessness an 8 and her anxiety at a 6. Denies SI and HI. A) Given support, reassurance and praise along with encouragement. Provided with a brief 1:1. Allowed to rest today and not pushed too hard. R) Pt denies SI and HI.

## 2015-06-03 NOTE — Progress Notes (Signed)
D:  +ve AH-command, pt contracts for safety. Pt denies SI/HI/VH. Pt is pleasant and cooperative. Pt stayed sleep in her room most of the evening, pt came out for her medications, pt stated she was very tired the whole day. Pt said she was feeling a little better.  A: Pt was offered support and encouragement. Pt was given scheduled medications. Pt was encourage to attend groups. Q 15 minute checks were done for safety.   R: Pt is taking medication. Pt has no complaints.Pt receptive to treatment and safety maintained on unit.

## 2015-06-04 ENCOUNTER — Encounter (HOSPITAL_COMMUNITY): Payer: Self-pay | Admitting: Nurse Practitioner

## 2015-06-04 NOTE — Progress Notes (Signed)
Adult Psychoeducational Group Note  Date:  06/04/2015 Time:  10:41 AM  Group Topic/Focus:  Overcoming Stress:   The focus of this group is to define stress and help patients assess their triggers.  Participation Level:  Active  Participation Quality:  Appropriate  Affect:  Appropriate  Cognitive:  Alert  Insight: Appropriate  Engagement in Group:  Engaged  Modes of Intervention:  Education  Additional Comments:    Rich BraveDuke, Emeri Estill Lynn 06/04/2015, 10:41 AM

## 2015-06-04 NOTE — Progress Notes (Signed)
Writer spoke with patient 1:1 in her room and she reports having had a good day and has been up most of the day today as opposed to sleeping most of the day and evening on yesterday. She reports that she had suicidal thoughts this morning but none since then but still hears voices and verbally contracts with staff. She denies hi and visual hallucinations. Support and encouragement given, safety maintained on unit with 15 min checks.

## 2015-06-04 NOTE — [facility] Group Notes (Signed)
[facility] LCSW Group Therapy  06/04/2015  1:15 PM  Type of Therapy:  Group Therapy  Participation Level:  Did Not Attend  Summary of Progress/Problems: Topic for today was thoughts and feelings regarding discharge. We discussed fears of upcoming changes including judegements, expectations and stigma of mental health issues. We then discussed supports: what constitutes a supportive framework, identification of supports and what to do when others are not supportive. Patient's then identified a specific coping tool to use when others are not available.   Catherine C Harrill, LCSW  

## 2015-06-04 NOTE — Progress Notes (Signed)
[facility] Group Notes:  (Nursing/MHT/Case Management/Adjunct)  Date:  06/04/2015  Time:  9:20 PM  Type of Therapy:  Psychoeducational Skills  Participation Level:  Active  Participation Quality:  Appropriate  Affect:  Appropriate  Cognitive:  Appropriate  Insight:  Appropriate  Engagement in Group:  Engaged  Modes of Intervention:  Education  Summary of Progress/Problems: The patient stated that she had a good day overall. She indicated that she felt more awake today as compared to yesterday. She also mentioned that she had a good visit with her mother this afternoon. As a theme for the day, her support system will be comprised of her mother, father, and fiance.   Loretta Hood S 06/04/2015, 9:20 PM

## 2015-06-04 NOTE — [facility] Group Notes (Signed)
[facility] Group Notes:  (Nursing/MHT/Case Management/Adjunct)  Date:  06/04/2015  Time:  12:25 PM  Type of Therapy:  Psychoeducational Skills  Participation Level:  Active  Participation Quality:  Appropriate  Affect:  Appropriate  Cognitive:  Alert  Insight:  Appropriate  Engagement in Group:  Engaged  Modes of Intervention:  Education  Summary of Progress/Problems:  Rich BraveDuke, Elgar Scoggins Lynn 06/04/2015, 12:25 PM
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
## 2015-06-04 NOTE — Progress Notes (Signed)
Loretta Hood is OOB UAL on the 500 hall today...she tolerates this fairly well. She stays awake this morning, unlike yesterday and is seen alert and attentive in both of her psycho ed RN groups ( held previously today).    A. She completed her daily assessment and on it she rated her depression, hopelessness and anxiety " 8/5/5", respectively. She says she is thinking about her situation and she is  glad to be back on her medications again.  She writes that she " sometimes " has SI...but she is able to contract with this RN to stay safe and seek staff guidance if otherwise.   R safety is in place and therapuetic relationship is fostered.

## 2015-06-05 LAB — PROLACTIN: Prolactin: 49.2 ng/mL — ABNORMAL HIGH (ref 4.8–23.3)

## 2015-06-05 NOTE — Progress Notes (Signed)
Patient ID: Loretta Hood, female   DOB: 06/17/1976, 39 y.o.   MRN: [id number] D: Patient reports that she is more depressed today and rates depression 5 on a 1 to 10 scale. States she is not feeling anxious today. Reports that her meds are making her sleepy and feel tired.  Patient having trouble staying awake in group. Denies SI HI AVH.  A: Patient given emotional support from RN. Patient given medications per MD orders. Patient encouraged to attend groups and unit activities. Patient encouraged to come to staff with any questions or concerns. Patient encouraged to talk to MD about drowsiness meds are causing her.  R: Patient remains cooperative and appropriate. Will continue to monitor patient for safety.

## 2015-06-05 NOTE — Progress Notes (Signed)
Adult Psychoeducational Group Note  Date:  06/05/2015 Time:  8:47 PM  Group Topic/Focus:  Wrap-Up Group:   The focus of this group is to help patients review their daily goal of treatment and discuss progress on daily workbooks.  Participation Level:  Active  Participation Quality:  Appropriate  Affect:  Appropriate  Cognitive:  Appropriate  Insight: Appropriate  Engagement in Group:  Engaged  Modes of Intervention:  Discussion  Additional Comments: The patient expressed that she attended group.The patient also said that her day was good.  Octavio Mannshigpen, Ardie Dragoo Lee 06/05/2015, 8:47 PM
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
## 2015-06-05 NOTE — Plan of Care (Signed)
Problem: Ineffective individual coping Goal: STG:Pt. will utilize relaxation techniques to reduce stress STG: Patient will utilize relaxation techniques to reduce stress levels  Outcome: Progressing Pt stated she used techniques learned here to helper when her stress levels elevated.   Problem: Diagnosis: Increased Risk For Suicide Attempt Goal: LTG-Patient Will Report Improvement in Psychotic Symptoms LTG (by discharge) : Patient will report improvement in psychotic symptoms.  Outcome: Progressing Pt denies AVH at this time

## 2015-06-05 NOTE — Progress Notes (Signed)
D: Pt denies SI/HI/AVH. Pt is pleasant and cooperative. Pt stated she was feeling a little better and was not as sleepy.   A: Pt was offered support and encouragement. Pt was given scheduled medications. Pt was encourage to attend groups. Q 15 minute checks were done for safety.   R:Pt attends groups and interacts well with peers and staff. Pt is taking medication. Pt has no complaints.Pt receptive to treatment and safety maintained on unit.

## 2015-06-05 NOTE — [facility] Group Notes (Signed)
[facility]   06/05/2015 4:31 PM  Participation Quality:  Minimal  Mood/Affect:  Flat  Depression Rating:  5  Anxiety Rating:  5  Thoughts of Suicide:  No Will you contract for safety?   NA  Current AVH:  Yes, but getting better  Plan for Discharge/Comments:  States she had visitors over the weekend.  Good supports.  Is happy with her medication.  Denies paranoia but states her thoughts are still a bit jumbled.  Transportation Means:   Supports:  Daryel GeraldNorth, Dayshon Roback B

## 2015-06-06 LAB — HEMOGLOBIN A1C
Hgb A1c MFr Bld: 5.7 % — ABNORMAL HIGH (ref 4.8–5.6)
Mean Plasma Glucose: 117 mg/dL

## 2015-06-06 LAB — VALPROIC ACID LEVEL: Valproic Acid Lvl: 64 ug/mL (ref 50.0–100.0)

## 2015-06-06 NOTE — [facility] Group Notes (Signed)
[facility] Group Notes:  (Nursing/MHT/Case Management/Adjunct)  Date:  06/06/2015  Time:  0900  Type of Therapy:  Nurse Education  Participation Level:  Did Not Attend  Participation Quality:    Affect:    Cognitive:    Insight:    Engagement in Group:    Modes of Intervention:    Summary of Progress/Problems: Patient invited to group however due to sedation from meds, elected to remain in bed asleep.  Merian CapronFriedman, Jaylun Fleener [facility] 06/06/2015, 0930

## 2015-06-06 NOTE — Progress Notes (Signed)
Patient ID: Loretta Hood, female   DOB: 11/18/1976, 39 y.o.   MRN: [id number] [facility] MD Progress Note  06/06/2015 5:14 PM Loretta Hood  MRN:  [id number] Subjective:    Patient reports she feels she is improving, and she feels less anxious and less depressed. At this time reports her auditory hallucinations, which had been distressing, have become much quieter, although have not resolved completely. She is tolerating medications well except for some sedation.    Objective:  Patient  seen and chart reviewed.  She reports a history of Bipolar Disorder, PTSD, and Anxiety, with frequent panic attacks. She has a previous history of substance abuse but has been sober, and this decompensation was not related to drug or alcohol use . She presented due to suicidal ideations, depression, anxiety and auditory hallucinations telling her to kill self . At this time she states she feels much better- her mood is improving, she feels less depressed, she feels less anxious, she is sleeping better, and auditory hallucinations have been subsiding. She denies medication side effects except for feeling sleepy at times- at this time presents fully alert and attentive-  she is on Minipress for PTSD related nightmares, Haldol for psychotic symptoms, Depakote ER for Mood Disorder . Behavior on unit in good control and no disruptive behaviors- has been attending groups.  At this time she is starting to focus on being discharged soon. Responds well to support, encouragement.  Valproic Acid Serum level  64- therapeutic    Principal Problem: Bipolar disorder, current episode depressed, severe, with psychotic features Diagnosis:   Patient Active Problem List   Diagnosis Date Noted  . Bipolar disorder, current episode depressed, severe, with psychotic features [F31.5] 06/02/2015  . PTSD (post-traumatic stress disorder) [F43.10] 06/02/2015  . Cocaine use disorder, severe, in sustained remission [F14.90] 06/02/2015  .  Cannabis use disorder, severe, in sustained remission [F12.90] 06/02/2015  . Opioid use disorder, moderate, in sustained remission [F11.90] 06/02/2015  . Alcohol use disorder, severe, in sustained remission [F10.99] 06/02/2015  . DYSPLASIA OF CERVIX UNSPECIFIED [N87.9] 10/02/2009  . FATIGUE [R53.81, R53.83] 10/02/2009  . MEMORY LOSS [R41.3] 10/02/2009  . NUMBNESS [R20.9] 10/02/2009  . DRUG ABUSE, HX OF [Z91.89] 10/02/2009   Total Time spent with patient: 20 minutes  Past Medical History:  Past Medical History  Diagnosis Date  . Anxiety   . Depression   . Alcoholic   . Drug addiction in remission   . Bipolar 1 disorder   . PTSD (post-traumatic stress disorder)   . Seizures     started in Dec 2015 - states last seizure 12/08/14 - has not seen neurologist   . H/O suicide attempt 2005    overdose    Past Surgical History  Procedure Laterality Date  . Cholecystectomy    . Tonsillectomy    . Tubal ligation     Family History:  Family History  Problem Relation Age of Onset  . Bipolar disorder Father    Social History:  History  Alcohol Use No    Comment: drinks beer 3 days/week - last drank 2013     History  Drug Use No    Comment: recovering alcoholic and opiate/cocaine abuser    History   Social History  . Marital Status: Single    Spouse Name: N/A  . Number of Children: N/A  . Years of Education: N/A   Social History Main Topics  . Smoking status: Former Games developer  . Smokeless tobacco: Never Used  .  Alcohol Use: No     Comment: drinks beer 3 days/week - last drank 2013  . Drug Use: No     Comment: recovering alcoholic and opiate/cocaine abuser  . Sexual Activity: Yes    Birth Control/ Protection: Surgical   Other Topics Concern  . None   Social History Narrative   Additional History:    Sleep: good   Appetite:  Good  Assessment:   Musculoskeletal: Strength & Muscle Tone: within normal limits Gait & Station: normal Patient leans:  N/A  Psychiatric Specialty Exam: Physical Exam  Review of Systems  Constitutional: Negative for fever, chills, weight loss, malaise/fatigue and diaphoresis.  HENT: Negative for ear discharge, ear pain, hearing loss, nosebleeds and tinnitus.   Eyes: Negative for blurred vision, double vision, photophobia, pain and discharge.  Respiratory: Negative for cough, hemoptysis, sputum production and shortness of breath.   Cardiovascular: Negative for chest pain, palpitations, orthopnea, claudication and leg swelling.  Gastrointestinal: Negative for heartburn, nausea, vomiting, abdominal pain, diarrhea, constipation and blood in stool.  Genitourinary: Negative for dysuria, urgency, frequency and hematuria.  Musculoskeletal: Negative for myalgias, back pain, joint pain and neck pain.  Skin: Negative for itching and rash.  Neurological: Negative for dizziness, tingling, tremors, sensory change, speech change, focal weakness, weakness and headaches.  Endo/Heme/Allergies: Negative for environmental allergies and polydipsia. Does not bruise/bleed easily.  Psychiatric/Behavioral: Positive for depression, suicidal ideas and hallucinations. Negative for memory loss and substance abuse. The patient is nervous/anxious.     Blood pressure 101/58, pulse 116, temperature 97.8 F (36.6 C), temperature source Oral, resp. rate 18, height 5\' 4"  (1.626 m), weight 208 lb (94.348 kg), last menstrual period 05/08/2015.Body mass index is 35.69 kg/(m^2).  General Appearance: Well Groomed  Patent attorneyye Contact::  Good  Speech:  Normal Rate  Volume:  Normal  Mood: describes mood as improving, less depressed   Affect:  Congruent, reactive, smiles appropriately at times   Thought Process:  Goal Directed and Intact  Orientation:  Full (Time, Place, and Person)  Thought Content:  Auditory hallucinations have been subsiding and at this time does not appear internally preoccupied. No delusions  Suicidal Thoughts:  No- at this time  denies any thoughts of hurting self or of suicide   Homicidal Thoughts:  No  Memory:  Immediate;   Good Recent;   Fair Remote;   Fair  Judgement:  Other:  improving   Insight:  Present  Psychomotor Activity:  Normal- no restlessness , no akathisia noted   Concentration:  Fair  Recall:  FiservFair  Fund of Knowledge:Fair  Language: Fair  Akathisia:  No  Handed:  Right  AIMS (if indicated):     Assets:  Communication Skills Desire for Improvement  ADL's:  Intact  Cognition: WNL  Sleep:  Number of Hours: 6.75   Current Medications: Current Facility-Administered Medications  Medication Dose Route Frequency Provider Last Rate Last Dose  . acetaminophen (TYLENOL) tablet 650 mg  650 mg Oral Q6H PRN Worthy FlankIjeoma E Nwaeze, NP      . albuterol (PROVENTIL HFA;VENTOLIN HFA) 108 (90 BASE) MCG/ACT inhaler 2 puff  2 puff Inhalation Q6H PRN Worthy FlankIjeoma E Nwaeze, NP      . alum & mag hydroxide-simeth (MAALOX/MYLANTA) 200-200-20 MG/5ML suspension 30 mL  30 mL Oral Q4H PRN Worthy FlankIjeoma E Nwaeze, NP   30 mL at 06/04/15 2002  . benztropine (COGENTIN) tablet 0.5 mg  0.5 mg Oral BID Jomarie LongsSaramma Eappen, MD   0.5 mg at 06/06/15 1641  . divalproex (DEPAKOTE)  DR tablet 500 mg  500 mg Oral Q12H Jomarie Longs, MD   500 mg at 06/06/15 0829  . doxepin (SINEQUAN) capsule 10 mg  10 mg Oral QHS,MR X 1 Saramma Eappen, MD   10 mg at 06/05/15 2108  . gabapentin (NEURONTIN) capsule 1,200 mg  1,200 mg Oral QHS Jomarie Longs, MD   1,200 mg at 06/05/15 2108  . gabapentin (NEURONTIN) capsule 600 mg  600 mg Oral Daily Worthy Flank, NP   600 mg at 06/06/15 1610  . haloperidol (HALDOL) tablet 5 mg  5 mg Oral BID Jomarie Longs, MD   5 mg at 06/06/15 1641  . hydrOXYzine (ATARAX/VISTARIL) tablet 50-100 mg  50-100 mg Oral TID PRN Worthy Flank, NP   50 mg at 06/02/15 0801  . magnesium hydroxide (MILK OF MAGNESIA) suspension 30 mL  30 mL Oral Daily PRN Worthy Flank, NP      . prazosin (MINIPRESS) capsule 2 mg  2 mg Oral QHS Worthy Flank, NP    2 mg at 06/05/15 2108  . sodium chloride (OCEAN) 0.65 % nasal spray 1 spray  1 spray Each Nare PRN Jomarie Longs, MD   1 spray at 06/05/15 1035    Lab Results:  Results for orders placed or performed during the hospital encounter of 06/02/15 (from the past 48 hour(s))  Valproic acid level     Status: None   Collection Time: 06/06/15  6:21 AM  Result Value Ref Range   Valproic Acid Lvl 64 50.0 - 100.0 ug/mL    Comment: Performed at [facility]    Physical Findings: AIMS: Facial and Oral Movements Muscles of Facial Expression: None, normal Lips and Perioral Area: None, normal Jaw: None, normal Tongue: None, normal,Extremity Movements Upper (arms, wrists, hands, fingers): None, normal Lower (legs, knees, ankles, toes): None, normal, Trunk Movements Neck, shoulders, hips: None, normal, Overall Severity Severity of abnormal movements (highest score from questions above): None, normal Incapacitation due to abnormal movements: None, normal Patient's awareness of abnormal movements (rate only patient's report): No Awareness, Dental Status Current problems with teeth and/or dentures?: No Does patient usually wear dentures?: No  CIWA:  CIWA-Ar Total: 0 COWS:  COWS Total Score: 1   Assessment- at this time patient improved compared to admission. Depression is improved and mood/affect improved, brighter. Regarding psychotic symptoms, states hallucinations are much improved but not completely gone yet- does not appear internally preoccupied . PTSD symptoms better controlled and improved sleep described . Depakote serum level therapeutic .  Treatment Plan Summary: Daily contact with patient to assess and evaluate symptoms and progress in treatment and Medication management  Continue crisis management and stabilization.  Consider discharge soon as she continues to improve . Continue Milieu participation and support. Medication management:  -Continue Haldol 5 mg BID for  psychosis -Continue Depakote DR 500 mgs BID for mood lability.  -Continue Doxepin 10 mg QHS  for sleep.  -Continue Prazosin 2 mg QHS  for nightmares.    Medical Decision Making:  Established Problem, Stable/Improving (1), Review or order clinical lab tests (1), Review and summation of old records (2), Review of Medication Regimen & Side Effects (2) and Review of New Medication or Change in Dosage (2)  Damien Cisar, NP-C 06/06/2015, 5:14 PM

## 2015-06-06 NOTE — Progress Notes (Signed)
The focus of this group is to help patients review their daily goal of treatment and discuss progress on daily workbooks. Pt did not attend the evening group. 

## 2015-06-06 NOTE — Progress Notes (Addendum)
Loretta BienenstockBrandy was soundly asleep this morning. States she slept well which is confirmed in chart but that she continues to experience sedation as a side effect of her medications. Patient was up by lunch time and affect less sleepy. She brightens with interaction, smiling occasionally. Mood is pleasant and behavior appropriate. She does however rate her depression at a 6/10, hopelessness at a 5/10 and anxiety at a 3/10. Asking about discharge date. Patient medicated per orders. Support offered. Patient encouraged to speak with provider, CM regarding discharge plan. Patient verbalized understanding. While she denied SI 1:1 to this Clinical research associatewriter, patient indicated on her self inventory that she has passive SI on and off. She verbally contracts for safety. No HI/AVH and remains safe. Loretta Hood, Loretta Hood

## 2015-06-06 NOTE — Plan of Care (Signed)
Problem: Ineffective individual coping Goal: STG: Patient will remain free from self harm Outcome: Progressing Patient has not engaged in self harm and denies SI at this time.   Problem: Diagnosis: Increased Risk For Suicide Attempt Goal: LTG-Patient Will Report Improved Mood and Deny Suicidal LTG (by discharge) Patient will report improved mood and deny suicidal ideation.  Outcome: Progressing Patient verbalizing improvement in mood. Smiling with 1:1 interaction. No SI today.

## 2015-06-06 NOTE — [facility] Group Notes (Signed)
[facility] Group Notes:  (Counselor/Nursing/MHT/Case Management/Adjunct)  06/06/2015 1:15PM  Type of Therapy:  Group Therapy  Participation Level:  Active  Participation Quality:  Appropriate  Affect:  Flat  Cognitive:  Oriented  Insight:  Improving  Engagement in Group:  Limited  Engagement in Therapy:  Limited  Modes of Intervention:  Discussion, Exploration and Socialization  Summary of Progress/Problems: The topic for group was balance in life.  Pt participated in the discussion about when their life was in balance and out of balance and how this feels.  Pt discussed ways to get back in balance and short term goals they can work on to get where they want to be. Began by stating she wished someone had woken her for earlier group [see previous group notes].  Stated she is still drowsey, but less so than yesterday, so has no complaints about meds.  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  Used to have 2 bikes but ultimately sold both of them for drugs.  Talked about the camaraderie of the club, and how she misses that, but also admitted that her time with them included a lot of alcohol and drug use.  Also talked about AA  "where people tell a lot of war stories and try to pick up women."  We used this as a springboard to talk about the importance of evaluating situations and communities, and how to decide if it is a  Positive or negative situation.   Daryel Geraldorth, Brookelin Felber B 06/06/2015 1:22 PM

## 2015-06-07 MED ORDER — BENZTROPINE MESYLATE 1 MG/ML IJ SOLN
1.0000 mg | Freq: Once | INTRAMUSCULAR | Status: AC
  Administered 2015-06-07: 1 mg via INTRAMUSCULAR
  Filled 2015-06-07: qty 1
  Filled 2015-06-07: qty 2

## 2015-06-07 MED ORDER — BENZTROPINE MESYLATE 1 MG PO TABS
1.0000 mg | ORAL_TABLET | Freq: Two times a day (BID) | ORAL | Status: DC
  Administered 2015-06-07 – 2015-06-08 (×2): 1 mg via ORAL
  Filled 2015-06-07: qty 6
  Filled 2015-06-07: qty 1
  Filled 2015-06-07: qty 6
  Filled 2015-06-07 (×4): qty 1
  Filled 2015-06-07 (×2): qty 6

## 2015-06-07 NOTE — Progress Notes (Signed)
Patient ID: Loretta Hood, female   DOB: 1975/12/17, 39 y.o.   MRN: [id number]  Pt currently presents with a pleasant affect and euthymic behavior. Per self inventory, pt rates depression, hopelessness and anxiety at a 3. Pt's daily goal is "going home and continuing recovery" and they intend to do so by "continue groups." Pt reports * sleep, * concentration, * energy and a * appetite. Pt reports a twisting in her hips and "weird spastic movements in my wrists" today. Pt states "I'm not sure why, it started happening a day ago, I was going to tell you since I thought I was going home today."   Pt provided with medications per providers orders. Pt's labs and vitals were monitored throughout the day. Pt supported emotionally and encouraged to express concerns and questions. Pt educated on medications and EPS signs and symptoms. Writer consulted with NP about physical movements, see MAR.   Pt's safety ensured with 15 minute and environmental checks. Pt currently denies SI/HI and A/V hallucinations. Pt verbally agrees to seek staff if SI/HI or A/VH occurs and to consult with staff before acting on these thoughts. Will continue POC. Pt to be discharged tomorrow, per MD.

## 2015-06-07 NOTE — Progress Notes (Signed)
D. Pt had been up, minimal interaction or participation in milieu, pt reports feeling better and is hopeful of discharge in the next day or two. Pt reports feeling a little drowsy with the medications but reports that she feels it is helpful. Pt did receive all medications without incident and did not verbalize any complaints of pain. A. Support and encouragement provided. R. Safety maintained, will continue to monitor.

## 2015-06-07 NOTE — Progress Notes (Signed)
Adult Psychoeducational Group Note  Date:  06/07/2015 Time:  8:34 PM  Group Topic/Focus:  Wrap-Up Group:   The focus of this group is to help patients review their daily goal of treatment and discuss progress on daily workbooks.  Participation Level:  Active  Participation Quality:  Appropriate  Affect:  Appropriate and Irritable  Cognitive:  Appropriate  Insight: Appropriate and Good  Engagement in Group:  Engaged  Modes of Intervention:  Support  Additional Comments:  Patient stated she had a good day and that she is excited about going home. She rated her day a 8.  Natasha MeadKiara M Aleera Gilcrease 06/07/2015, 8:34 PM

## 2015-06-07 NOTE — [facility] Group Notes (Signed)
[facility]  06/07/2015 , 1:26 PM    Type of Therapy:  Mental [employer] Presentation  Participation Level:  Active  Participation Quality:  Attentive  Affect:  Blunted  Cognitive:  Oriented  Insight:  Limited  Engagement in Therapy:  Engaged  Modes of Intervention:  Discussion, Education and Socialization  Summary of Progress/Problems:  Onalee HuaDavid from [employer] came to present his recovery story and play the guitar.  Avian stayed for the first half.  She was called out, and did not return.  Daryel Geraldorth, Philippa Vessey B 06/07/2015 , 1:26 PM

## 2015-06-07 NOTE — [facility] Suicide Risk Assessment (Signed)
[facility] INPATIENT:  Family/Significant Other Suicide Prevention Education  Suicide Prevention Education:  Education Completed; Loretta Hood, mother, [phone] has been identified by the patient as the family member/significant other with whom the patient will be residing, and identified as the person(s) who will aid the patient in the event of a mental health crisis (suicidal ideations/suicide attempt).  With written consent from the patient, the family member/significant other has been provided the following suicide prevention education, prior to the and/or following the discharge of the patient.  The suicide prevention education provided includes the following:  Suicide risk factors  Suicide prevention and interventions  National Suicide Hotline telephone number  [facility] assessment telephone number  [facility] 911  [facility] and/or [facility]idential [facility] telephone number  Request made of family/significant other to:  Remove weapons (e.g., guns, rifles, knives), all items previously/currently identified as safety concern.    Remove drugs/medications (over-the-counter, prescriptions, illicit drugs), all items previously/currently identified as a safety concern.  The family member/significant other verbalizes understanding of the suicide prevention education information provided.  The family member/significant other agrees to remove the items of safety concern listed above.  Loretta Hood, Loretta Hood 06/07/2015, 11:03 AM

## 2015-06-07 NOTE — Progress Notes (Signed)
Patient ID: Loretta Hood, female   DOB: Jul 06, 1976, 39 y.o.   MRN: [id number] [facility] MD Progress Note  06/07/2015 7:33 PM LINDSEY DEMONTE  MRN:  [id number] Subjective:   Patient states she is feeling better and is hoping to discharge soon. She had developed some " twitches in my wrists", which were felt by staff to be EPS related- has been started on Cogentin and they have resolved. She states hallucinations have been very mild today. Denies any other medication side effects.   Objective:  Patient  seen and chart reviewed.  She reports a history of Bipolar Disorder, PTSD, and Anxiety, with frequent panic attacks. She has a previous history of substance abuse  At this time patient is improved , with improved mood, improved range of affect, and with much improvement , although not complete resolution, of her auditory hallucinations. On unit has been pleasant, calm, without behavioral disturbance .  We have discussed medication side effects and discussed potentially decreasing dose of Haldol due to some EPS earlier today, as above. Patient prefers to continue Haldol at this time, as she states she is very relieved that hallucinations  Are so much better. Valproic Acid is 64- within therapeutic limits .   Principal Problem: Bipolar disorder, current episode depressed, severe, with psychotic features Diagnosis:   Patient Active Problem List   Diagnosis Date Noted  . Bipolar disorder, current episode depressed, severe, with psychotic features [F31.5] 06/02/2015  . PTSD (post-traumatic stress disorder) [F43.10] 06/02/2015  . Cocaine use disorder, severe, in sustained remission [F14.90] 06/02/2015  . Cannabis use disorder, severe, in sustained remission [F12.90] 06/02/2015  . Opioid use disorder, moderate, in sustained remission [F11.90] 06/02/2015  . Alcohol use disorder, severe, in sustained remission [F10.99] 06/02/2015  . DYSPLASIA OF CERVIX UNSPECIFIED [N87.9] 10/02/2009  . FATIGUE [R53.81,  R53.83] 10/02/2009  . MEMORY LOSS [R41.3] 10/02/2009  . NUMBNESS [R20.9] 10/02/2009  . DRUG ABUSE, HX OF [Z91.89] 10/02/2009   Total Time spent with patient: 20 minutes  Past Medical History:  Past Medical History  Diagnosis Date  . Anxiety   . Depression   . Alcoholic   . Drug addiction in remission   . Bipolar 1 disorder   . PTSD (post-traumatic stress disorder)   . Seizures     started in Dec 2015 - states last seizure 12/08/14 - has not seen neurologist   . H/O suicide attempt 2005    overdose    Past Surgical History  Procedure Laterality Date  . Cholecystectomy    . Tonsillectomy    . Tubal ligation     Family History:  Family History  Problem Relation Age of Onset  . Bipolar disorder Father    Social History:  History  Alcohol Use No    Comment: drinks beer 3 days/week - last drank 2013     History  Drug Use No    Comment: recovering alcoholic and opiate/cocaine abuser    History   Social History  . Marital Status: Single    Spouse Name: N/A  . Number of Children: N/A  . Years of Education: N/A   Social History Main Topics  . Smoking status: Former Games developer  . Smokeless tobacco: Never Used  . Alcohol Use: No     Comment: drinks beer 3 days/week - last drank 2013  . Drug Use: No     Comment: recovering alcoholic and opiate/cocaine abuser  . Sexual Activity: Yes    Birth Control/ Protection: Surgical   Other  Topics Concern  . None   Social History Narrative   Additional History:    Sleep: good   Appetite:  Good  Assessment:   Musculoskeletal: Strength & Muscle Tone: within normal limits Gait & Station: normal Patient leans: N/A  Psychiatric Specialty Exam: Physical Exam  Review of Systems  Constitutional: Negative for fever, chills, weight loss, malaise/fatigue and diaphoresis.  HENT: Negative for ear discharge, ear pain, hearing loss, nosebleeds and tinnitus.   Eyes: Negative for blurred vision, double vision, photophobia, pain and  discharge.  Respiratory: Negative for cough, hemoptysis, sputum production and shortness of breath.   Cardiovascular: Negative for chest pain, palpitations, orthopnea, claudication and leg swelling.  Gastrointestinal: Negative for heartburn, nausea, vomiting, abdominal pain, diarrhea, constipation and blood in stool.  Genitourinary: Negative for dysuria, urgency, frequency and hematuria.  Musculoskeletal: Negative for myalgias, back pain, joint pain and neck pain.  Skin: Negative for itching and rash.  Neurological: Negative for dizziness, tingling, tremors, sensory change, speech change, focal weakness, weakness and headaches.  Endo/Heme/Allergies: Negative for environmental allergies and polydipsia. Does not bruise/bleed easily.  Psychiatric/Behavioral: Positive for depression, suicidal ideas and hallucinations. Negative for memory loss and substance abuse. The patient is nervous/anxious.     Blood pressure 115/71, pulse 105, temperature 98.3 F (36.8 C), temperature source Oral, resp. rate 18, height 5\' 4"  (1.626 m), weight 208 lb (94.348 kg), last menstrual period 05/08/2015.Body mass index is 35.69 kg/(m^2).  General Appearance: Well Groomed  Eye Contact::  Good  Speech:  Normal Rate  Volume:  Normal  Mood:  At this time denies depression  Affect:   Reactive, pleasant   Thought Process:  Goal Directed and Intact  Orientation:  Full (Time, Place, and Person)  Thought Content:  Decreased auditory hallucinations, not internally preoccupied at this time- no delusions expressed .  Suicidal Thoughts:  No- at this time denies any thoughts of hurting self or of suicide   Homicidal Thoughts:  No  Memory:  Recent and remote grossly intact   Judgement:  Other:  improving   Insight:  Present  Psychomotor Activity:  Normal- no restlessness , no akathisia noted - currently no EPS noted, no abnormal involuntary movements noted .  Concentration:  Good  Recall:  Good  Fund of Knowledge:Good   Language: Good  Akathisia:  No  Handed:  Right  AIMS (if indicated):     Assets:  Communication Skills Desire for Improvement  ADL's:  Intact  Cognition: WNL  Sleep:  Number of Hours: 6.5   Current Medications: Current Facility-Administered Medications  Medication Dose Route Frequency Provider Last Rate Last Dose  . acetaminophen (TYLENOL) tablet 650 mg  650 mg Oral Q6H PRN Worthy FlankIjeoma E Nwaeze, NP      . albuterol (PROVENTIL HFA;VENTOLIN HFA) 108 (90 BASE) MCG/ACT inhaler 2 puff  2 puff Inhalation Q6H PRN Worthy FlankIjeoma E Nwaeze, NP      . alum & mag hydroxide-simeth (MAALOX/MYLANTA) 200-200-20 MG/5ML suspension 30 mL  30 mL Oral Q4H PRN Worthy FlankIjeoma E Nwaeze, NP   30 mL at 06/04/15 2002  . benztropine (COGENTIN) tablet 1 mg  1 mg Oral BID Shuvon B Rankin, NP   1 mg at 06/07/15 1720  . divalproex (DEPAKOTE) DR tablet 500 mg  500 mg Oral Q12H Jomarie LongsSaramma Eappen, MD   500 mg at 06/07/15 0804  . doxepin (SINEQUAN) capsule 10 mg  10 mg Oral QHS,MR X 1 Saramma Eappen, MD   10 mg at 06/06/15 2052  . gabapentin (NEURONTIN) capsule  1,200 mg  1,200 mg Oral QHS Jomarie Longs, MD   1,200 mg at 06/06/15 2053  . gabapentin (NEURONTIN) capsule 600 mg  600 mg Oral Daily Worthy Flank, NP   600 mg at 06/07/15 1610  . haloperidol (HALDOL) tablet 5 mg  5 mg Oral BID Jomarie Longs, MD   5 mg at 06/07/15 1720  . hydrOXYzine (ATARAX/VISTARIL) tablet 50-100 mg  50-100 mg Oral TID PRN Worthy Flank, NP   50 mg at 06/06/15 1719  . magnesium hydroxide (MILK OF MAGNESIA) suspension 30 mL  30 mL Oral Daily PRN Worthy Flank, NP      . prazosin (MINIPRESS) capsule 2 mg  2 mg Oral QHS Worthy Flank, NP   2 mg at 06/06/15 2052  . sodium chloride (OCEAN) 0.65 % nasal spray 1 spray  1 spray Each Nare PRN Jomarie Longs, MD   1 spray at 06/07/15 1350    Lab Results:  Results for orders placed or performed during the hospital encounter of 06/02/15 (from the past 48 hour(s))  Valproic acid level     Status: None   Collection  Time: 06/06/15  6:21 AM  Result Value Ref Range   Valproic Acid Lvl 64 50.0 - 100.0 ug/mL    Comment: Performed at [facility]    Physical Findings: AIMS: Facial and Oral Movements Muscles of Facial Expression: None, normal Lips and Perioral Area: None, normal Jaw: None, normal Tongue: None, normal,Extremity Movements Upper (arms, wrists, hands, fingers): None, normal Lower (legs, knees, ankles, toes): None, normal, Trunk Movements Neck, shoulders, hips: None, normal, Overall Severity Severity of abnormal movements (highest score from questions above): None, normal Incapacitation due to abnormal movements: None, normal Patient's awareness of abnormal movements (rate only patient's report): No Awareness, Dental Status Current problems with teeth and/or dentures?: No Does patient usually wear dentures?: No  CIWA:  CIWA-Ar Total: 0 COWS:  COWS Total Score: 1   Assessment-  Continues to improve significantly compared to admission- with fuller range of affect and overall improved mood . Psychotic symptoms improved , and almost resolved at this time.  Developed some mild EPS movements, now resolved on Cogentin. PTSD symptoms better controlled . Sleep is currently stable/good . Depakote serum level therapeutic . We discussed medication side effects- aware of potential for involuntary movement disorder, tardive dyskinesia on Haldol- does not currently want to cut down Haldol dose as it has helped decrease psychotic symptoms dramatically. Patient hoping to go home soon.  Treatment Plan Summary: Daily contact with patient to assess and evaluate symptoms and progress in treatment and Medication management  Continue crisis management and stabilization.  Consider discharge soon as she continues to improve . Continue Milieu participation and support. Medication management:  -Continue Haldol 5 mg BID for psychosis -Continue Depakote DR 500 mgs BID for mood lability.  -Continue  Doxepin 10 mg QHS  for sleep.  -Continue Prazosin 2 mg QHS  for nightmares.  -Now on Cogentin 1 mgr BID for EPS.    Medical Decision Making:  Established Problem, Stable/Improving (1), Review or order clinical lab tests (1), Review and summation of old records (2), Review of Medication Regimen & Side Effects (2) and Review of New Medication or Change in Dosage (2)  Nehemiah Massed, MD  06/07/2015, 7:33 PM

## 2015-06-08 MED ORDER — BENZTROPINE MESYLATE 1 MG PO TABS: 1.0000 mg | ORAL_TABLET | Freq: Two times a day (BID) | ORAL | Status: DC

## 2015-06-08 MED ORDER — BENZTROPINE MESYLATE 2 MG PO TABS: 2.0000 mg | ORAL_TABLET | Freq: Two times a day (BID) | ORAL | Status: DC

## 2015-06-08 MED ORDER — DIVALPROEX SODIUM 500 MG PO DR TAB: 500.0000 mg | DELAYED_RELEASE_TABLET | Freq: Two times a day (BID) | ORAL | Status: DC

## 2015-06-08 MED ORDER — PRAZOSIN HCL 2 MG PO CAPS: 2.0000 mg | ORAL_CAPSULE | Freq: Every day | ORAL | Status: DC

## 2015-06-08 MED ORDER — DOXEPIN HCL 10 MG PO CAPS: 10.0000 mg | ORAL_CAPSULE | Freq: Every evening | ORAL | Status: DC | PRN

## 2015-06-08 MED ORDER — BENZTROPINE MESYLATE 2 MG PO TABS: 1.0000 mg | ORAL_TABLET | Freq: Two times a day (BID) | ORAL | Status: DC

## 2015-06-08 MED ORDER — HALOPERIDOL 5 MG PO TABS: 5.0000 mg | ORAL_TABLET | Freq: Two times a day (BID) | ORAL | Status: DC

## 2015-06-08 MED ORDER — HYDROXYZINE HCL 50 MG PO TABS: 50.0000 mg | ORAL_TABLET | Freq: Three times a day (TID) | ORAL | Status: DC | PRN

## 2015-06-08 MED ORDER — GABAPENTIN 300 MG PO CAPS: 600.0000 mg | ORAL_CAPSULE | Freq: Every day | ORAL | Status: DC

## 2015-06-08 NOTE — Tx Team (Signed)
Interdisciplinary Treatment Plan Update (Adult)  Date:  06/08/2015   Time Reviewed:  9:41 AM   Progress in Treatment: Attending groups: Yes. Participating in groups:  Yes. Taking medication as prescribed:  Yes. Tolerating medication:  Yes. Family/Significant othe contact made:  Yes Patient understands diagnosis:  Yes   Discussing patient identified problems/goals with staff:  Yes, see initial care plan. Medical problems stabilized or resolved:  Yes. Denies suicidal/homicidal ideation: Yes. Issues/concerns per patient self-inventory:  No. Other:  New problem(s) identified:  Discharge Plan or Barriers: return home, follow up outpt  Reason for Continuation of Hospitalization:   Comments:  Estimated length of stay: D/C today  New goal(s):  Review of initial/current patient goals per problem list:     Attendees: Patient:  06/08/2015 9:41 AM   Family:   06/08/2015 9:41 AM   Physician:  Jomarie LongsSaramma Eappen, MD 06/08/2015 9:41 AM   Nursing:   Marzetta Boardhrista Dopson, RN 06/08/2015 9:41 AM   CSW:    Daryel Geraldodney Mayline Dragon, LCSW   06/08/2015 9:41 AM   Other:  06/08/2015 9:41 AM   Other:   06/08/2015 9:41 AM   Other:  Onnie BoerJennifer Clark, Nurse CM 06/08/2015 9:41 AM   Other:  Leisa LenzValerie Enoch, [facility] 06/08/2015 9:41 AM   Other:  Tomasita Morrowelora Sutton, P4CC  06/08/2015 9:41 AM   Other:  06/08/2015 9:41 AM   Other:  06/08/2015 9:41 AM   Other:  06/08/2015 9:41 AM   Other:  06/08/2015 9:41 AM   Other:  06/08/2015 9:41 AM   Other:   06/08/2015 9:41 AM    Scribe for Treatment Team:   Ida RogueNorth, Gerardine Peltz B, 06/08/2015 9:41 AM
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
## 2015-06-08 NOTE — Progress Notes (Signed)
  [facility]  Will you be returning to the same living situation after discharge:  Yes,  home At discharge, do you have transportation home?: Yes,  family Do you have the ability to pay for your medications: Yes,  mental health  Release of information consent forms completed and in the chart;  Patient's signature needed at discharge.  Patient to Follow up at: Follow-up Information    Follow up with [facility] On 06/19/2015.   Why:  Monday at 9:00 with Bary CastillaShelly Everts for medication management.  When you go to this appointment, ask about seeing a therapist there. If you don't have one you have seen in the past, I would recomend asking for Kateri McMarieda Grossman-Orr if she has any openings.   Contact information:   [address]  [place]  [phone]      Patient denies SI/HI: Yes,  yes    Safety Planning and Suicide Prevention discussed: Yes,  yes  Have you used any form of tobacco in the last 30 days? (Cigarettes, Smokeless Tobacco, Cigars, and/or Pipes): No  Has patient been referred to the Quitline?: N/A patient is not a smoker  [place], Anthonella Klausner B 06/08/2015, 9:46 AM

## 2015-06-08 NOTE — Progress Notes (Signed)
D. Pt had been up and visible in milieu, did attend and participate in evening group activity. Pt did speak about having a good day today, did mention about some movements in wrists and hips that occurred earlier when asked and did not report and worrisome hallucinations. Pt also spoke about possible discharge for in the morning, and did receive all medications without incident. A. Support and encouragement provided. R. Safety maintained, will continue to monitor.

## 2015-06-08 NOTE — Progress Notes (Signed)
Patient ID: Loretta ReveringBrandy F Hood, female   DOB: Dec 02, 1976, 39 y.o.   MRN: [id number]  Pt. Denies SI/HI and A/V hallucinations. Belongings returned to patient at time of discharge. Patient denies any pain or discomfort. Discharge instructions and medications were reviewed with patient. Patient verbalized understanding of both medications and discharge instructions. Patient was discharged to lobby where her mother picked her up. No distress noted upon discharge. Q15 minute safety checks maintained until discharge.

## 2015-06-09 NOTE — Discharge Summary (Signed)
Physician Discharge Summary Note  Patient:  Loretta Hood is an 39 y.o., female MRN:  [id number] DOB:  29-Aug-1976 Patient phone:  [phone] (home)  Patient address:   [address] [place] [address],  Total Time spent with patient: 45 minutes  Date of Admission:  06/02/2015 Date of Discharge: 06/08/2015  Reason for Admission:  Psychosis Principal Problem: Bipolar disorder, current episode depressed, severe, with psychotic features Discharge Diagnoses: Patient Active Problem List   Diagnosis Date Noted  . Bipolar disorder, current episode depressed, severe, with psychotic features [F31.5] 06/02/2015  . PTSD (post-traumatic stress disorder) [F43.10] 06/02/2015  . Cocaine use disorder, severe, in sustained remission [F14.90] 06/02/2015  . Cannabis use disorder, severe, in sustained remission [F12.90] 06/02/2015  . Opioid use disorder, moderate, in sustained remission [F11.90] 06/02/2015  . Alcohol use disorder, severe, in sustained remission [F10.99] 06/02/2015  . DYSPLASIA OF CERVIX UNSPECIFIED [N87.9] 10/02/2009  . FATIGUE [R53.81, R53.83] 10/02/2009  . MEMORY LOSS [R41.3] 10/02/2009  . NUMBNESS [R20.9] 10/02/2009  . DRUG ABUSE, HX OF [Z91.89] 10/02/2009    Musculoskeletal: Strength & Muscle Tone: within normal limits Gait & Station: normal Patient leans: N/A  Psychiatric Specialty Exam:  SEE SRA Physical Exam  Vitals reviewed.   ROS  Blood pressure 112/67, pulse 101, temperature 98.1 F (36.7 C), temperature source Oral, resp. rate 18, height 5\' 4"  (1.626 m), weight 94.348 kg (208 lb), last menstrual period 05/08/2015.Body mass index is 35.69 kg/(m^2).  Have you used any form of tobacco in the last 30 days? (Cigarettes, Smokeless Tobacco, Cigars, and/or Pipes): No  Has this patient used any form of tobacco in the last 30 days? (Cigarettes, Smokeless Tobacco, Cigars, and/or Pipes) N/A  Past Medical History:  Past Medical History  Diagnosis Date  . Anxiety   .  Depression   . Alcoholic   . Drug addiction in remission   . Bipolar 1 disorder   . PTSD (post-traumatic stress disorder)   . Seizures     started in Dec 2015 - states last seizure 12/08/14 - has not seen neurologist   . H/O suicide attempt 2005    overdose    Past Surgical History  Procedure Laterality Date  . Cholecystectomy    . Tonsillectomy    . Tubal ligation     Family History:  Family History  Problem Relation Age of Onset  . Bipolar disorder Father    Social History:  History  Alcohol Use No    Comment: drinks beer 3 days/week - last drank 2013     History  Drug Use No    Comment: recovering alcoholic and opiate/cocaine abuser    History   Social History  . Marital Status: Single    Spouse Name: N/A  . Number of Children: N/A  . Years of Education: N/A   Social History Main Topics  . Smoking status: Former Games developer  . Smokeless tobacco: Never Used  . Alcohol Use: No     Comment: drinks beer 3 days/week - last drank 2013  . Drug Use: No     Comment: recovering alcoholic and opiate/cocaine abuser  . Sexual Activity: Yes    Birth Control/ Protection: Surgical   Other Topics Concern  . None   Social History Narrative   Risk to Self: Is patient at risk for suicide?: Yes Risk to Others:   Prior Inpatient Therapy:   Prior Outpatient Therapy:    Level of Care:  OP  Hospital Course:  Loretta Hood is  a 39 y.o. Caucasian female , single , unemployed , has applied for SSD, currently lives with mom in [place], has a past hx of Bipolar disorder, PTSD , [facility] with complaints of hearing voices telling her to kill herself.  Loretta ReveringBrandy F Hood was admitted for Bipolar disorder, current episode depressed, severe, with psychotic features and crisis management.  She was treated discharged with the medications listed below under Medication List.  Medical problems were identified and treated as needed.  Home medications were restarted as  appropriate.  Improvement was monitored by observation and Fredderick PhenixBrandy F Hood daily report of symptom reduction.  Emotional and mental status was monitored by daily self-inventory reports completed by Loretta ReveringBrandy F Hood and clinical staff.         Loretta ReveringBrandy F Hood was evaluated by the treatment team for stability and plans for continued recovery upon discharge.  Loretta ReveringBrandy F Hood motivation was an integral factor for scheduling further treatment.  Employment, transportation, bed availability, health status, family support, and any pending legal issues were also considered during her hospital stay.  She was offered further treatment options upon discharge including but not limited to Residential, Intensive Outpatient, and Outpatient treatment.  Loretta ReveringBrandy F Hood will follow up with the services as listed below under Follow Up Information.     Upon completion of this admission the patient was both mentally and medically stable for discharge denying suicidal/homicidal ideation, auditory/visual/tactile hallucinations, delusional thoughts and paranoia.      Consults:  psychiatry  Significant Diagnostic Studies:  labs: per ED  Discharge Vitals:   Blood pressure 112/67, pulse 101, temperature 98.1 F (36.7 C), temperature source Oral, resp. rate 18, height 5\' 4"  (1.626 m), weight 94.348 kg (208 lb), last menstrual period 05/08/2015. Body mass index is 35.69 kg/(m^2). Lab Results:   No results found for this or any previous visit (from the past 72 hour(s)).  Physical Findings: AIMS: Facial and Oral Movements Muscles of Facial Expression: None, normal Lips and Perioral Area: None, normal Jaw: None, normal Tongue: None, normal,Extremity Movements Upper (arms, wrists, hands, fingers): None, normal Lower (legs, knees, ankles, toes): None, normal, Trunk Movements Neck, shoulders, hips: None, normal, Overall Severity Severity of abnormal movements (highest score from questions above): None,  normal Incapacitation due to abnormal movements: None, normal Patient's awareness of abnormal movements (rate only patient's report): No Awareness, Dental Status Current problems with teeth and/or dentures?: No Does patient usually wear dentures?: No  CIWA:  CIWA-Ar Total: 0 COWS:  COWS Total Score: 1   See Psychiatric Specialty Exam and Suicide Risk Assessment completed by Attending Physician prior to discharge.  Discharge destination:  Home  Is patient on multiple antipsychotic therapies at discharge:  No   Has Patient had three or more failed trials of antipsychotic monotherapy by history:  No    Recommended Plan for Multiple Antipsychotic Therapies: NA     Medication List    STOP taking these medications        albuterol 108 (90 BASE) MCG/ACT inhaler  Commonly known as:  PROVENTIL HFA;VENTOLIN HFA     busPIRone 10 MG tablet  Commonly known as:  BUSPAR     doxylamine (Sleep) 25 MG tablet  Commonly known as:  UNISOM     hydrOXYzine 50 MG capsule  Commonly known as:  VISTARIL  Replaced by:  hydrOXYzine 50 MG tablet     LATUDA 60 MG Tabs  Generic drug:  Lurasidone HCl      TAKE these medications  Indication   benztropine 2 MG tablet  Commonly known as:  COGENTIN  Take 1 tablet (2 mg total) by mouth 2 (two) times daily.   Indication:  Extrapyramidal Reaction caused by Medications     divalproex 500 MG DR tablet  Commonly known as:  DEPAKOTE  Take 1 tablet (500 mg total) by mouth every 12 (twelve) hours.   Indication:  mood Stabilization     doxepin 10 MG capsule  Commonly known as:  SINEQUAN  Take 1 capsule (10 mg total) by mouth at bedtime and may repeat dose one time if needed.   Indication:  Major Depressive Disorder, insomnia     gabapentin 300 MG capsule  Commonly known as:  NEURONTIN  Take 2 capsules (600 mg total) by mouth daily. Take 2 capsules (600 mg) in the morning.  Take 4 capsules (1200 mg) in the evening.   Indication:  Agitation,  Neuropathic Pain     haloperidol 5 MG tablet  Commonly known as:  HALDOL  Take 1 tablet (5 mg total) by mouth 2 (two) times daily.   Indication:  Mood Stabilization     hydrOXYzine 50 MG tablet  Commonly known as:  ATARAX/VISTARIL  Take 1-2 tablets (50-100 mg total) by mouth 3 (three) times daily as needed for anxiety.   Indication:  Anxiety Neurosis     prazosin 2 MG capsule  Commonly known as:  MINIPRESS  Take 1 capsule (2 mg total) by mouth at bedtime.   Indication:  agitation and anxiety           Follow-up Information    Follow up with [facility] On 06/19/2015.   Why:  Monday at 9:00 with Bary Castilla for medication management.  When you go to this appointment, ask about seeing a therapist there. If you don't have one you have seen in the past, I would recomend asking for Kateri Mc if she has any openings.   Contact information:   [address]  [place]  [phone]      Follow-up recommendations:  Activity:  as tol, diet as tol  Comments:  1.  Take all your medications as prescribed.              2.  Report any adverse side effects to outpatient provider.                       3.  Patient instructed to not use alcohol or illegal drugs while on prescription medicines.            4.  In the event of worsening symptoms, instructed patient to call 911, the crisis hotline or go to nearest emergency room for evaluation of symptoms.  Total Discharge Time: 40 min  Signed: Velna Hatchet May Agustin AGNP-BC 06/09/2015, 4:11 PM  I personally assessed the patient and formulated the plan Madie Reno A. Dub Mikes, M.D.

## 2015-09-27 ENCOUNTER — Encounter (HOSPITAL_BASED_OUTPATIENT_CLINIC_OR_DEPARTMENT_OTHER): Payer: Self-pay

## 2015-09-27 ENCOUNTER — Emergency Department (HOSPITAL_BASED_OUTPATIENT_CLINIC_OR_DEPARTMENT_OTHER): Payer: 59

## 2015-09-27 ENCOUNTER — Emergency Department (HOSPITAL_BASED_OUTPATIENT_CLINIC_OR_DEPARTMENT_OTHER)
Admission: EM | Admit: 2015-09-27 | Discharge: 2015-09-27 | Disposition: A | Discharge status: Home / Self Care | Payer: 59 | Attending: Emergency Medicine | Admitting: Emergency Medicine

## 2015-09-27 DIAGNOSIS — J45909 Unspecified asthma, uncomplicated: Secondary | ICD-10-CM | POA: Insufficient documentation

## 2015-09-27 DIAGNOSIS — F419 Anxiety disorder, unspecified: Secondary | ICD-10-CM | POA: Insufficient documentation

## 2015-09-27 DIAGNOSIS — Z79899 Other long term (current) drug therapy: Secondary | ICD-10-CM | POA: Insufficient documentation

## 2015-09-27 DIAGNOSIS — M542 Cervicalgia: Secondary | ICD-10-CM

## 2015-09-27 DIAGNOSIS — G40909 Epilepsy, unspecified, not intractable, without status epilepticus: Secondary | ICD-10-CM | POA: Insufficient documentation

## 2015-09-27 DIAGNOSIS — Z915 Personal history of self-harm: Secondary | ICD-10-CM | POA: Insufficient documentation

## 2015-09-27 DIAGNOSIS — R499 Unspecified voice and resonance disorder: Secondary | ICD-10-CM

## 2015-09-27 DIAGNOSIS — F4310 Post-traumatic stress disorder, unspecified: Secondary | ICD-10-CM | POA: Insufficient documentation

## 2015-09-27 DIAGNOSIS — R490 Dysphonia: Secondary | ICD-10-CM | POA: Insufficient documentation

## 2015-09-27 DIAGNOSIS — H538 Other visual disturbances: Secondary | ICD-10-CM

## 2015-09-27 DIAGNOSIS — F319 Bipolar disorder, unspecified: Secondary | ICD-10-CM | POA: Insufficient documentation

## 2015-09-27 DIAGNOSIS — Z3202 Encounter for pregnancy test, result negative: Secondary | ICD-10-CM | POA: Insufficient documentation

## 2015-09-27 DIAGNOSIS — Z87891 Personal history of nicotine dependence: Secondary | ICD-10-CM | POA: Insufficient documentation

## 2015-09-27 HISTORY — DX: Unspecified asthma, uncomplicated: J45.909

## 2015-09-27 LAB — BASIC METABOLIC PANEL
Anion gap: 6 (ref 5–15)
BUN: 16 mg/dL (ref 6–20)
CHLORIDE: 105 mmol/L (ref 101–111)
CO2: 26 mmol/L (ref 22–32)
CREATININE: 0.99 mg/dL (ref 0.44–1.00)
Calcium: 8.7 mg/dL — ABNORMAL LOW (ref 8.9–10.3)
GFR calc non Af Amer: >60 mL/min (ref >60)
Glucose, Bld: 201 mg/dL — ABNORMAL HIGH (ref 65–99)
Potassium: 3.8 mmol/L (ref 3.5–5.1)
SODIUM: 137 mmol/L (ref 135–145)

## 2015-09-27 LAB — CBC WITH DIFFERENTIAL/PLATELET
BASOS PCT: 0 %
Basophils Absolute: 0 10*3/uL (ref 0.0–0.1)
EOS PCT: 1 %
Eosinophils Absolute: 0.2 10*3/uL (ref 0.0–0.7)
HCT: 39.3 % (ref 36.0–46.0)
Hemoglobin: 12.9 g/dL (ref 12.0–15.0)
LYMPHS ABS: 1.9 10*3/uL (ref 0.7–4.0)
Lymphocytes Relative: 12 %
MCH: 29.7 pg (ref 26.0–34.0)
MCHC: 32.8 g/dL (ref 30.0–36.0)
MCV: 90.3 fL (ref 78.0–100.0)
MONO ABS: 0.9 10*3/uL (ref 0.1–1.0)
Monocytes Relative: 6 %
Neutro Abs: 12.8 10*3/uL — ABNORMAL HIGH (ref 1.7–7.7)
Neutrophils Relative %: 81 %
PLATELETS: 314 10*3/uL (ref 150–400)
RBC: 4.35 MIL/uL (ref 3.87–5.11)
RDW: 13.1 % (ref 11.5–15.5)
WBC: 15.8 10*3/uL — ABNORMAL HIGH (ref 4.0–10.5)

## 2015-09-27 LAB — URINALYSIS, ROUTINE W REFLEX MICROSCOPIC
BILIRUBIN URINE: NEGATIVE
HGB URINE DIPSTICK: NEGATIVE
KETONES UR: NEGATIVE mg/dL
Leukocytes, UA: NEGATIVE
Nitrite: POSITIVE — AB
PH: 6 (ref 5.0–8.0)
Protein, ur: NEGATIVE mg/dL
SPECIFIC GRAVITY, URINE: 1.022 (ref 1.005–1.030)
Urobilinogen, UA: 0.2 mg/dL (ref 0.0–1.0)

## 2015-09-27 LAB — TSH: TSH: 1.068 u[IU]/mL (ref 0.350–4.500)

## 2015-09-27 LAB — URINE MICROSCOPIC-ADD ON

## 2015-09-27 LAB — PREGNANCY, URINE: PREG TEST UR: NEGATIVE

## 2015-09-27 MED ORDER — IOHEXOL 350 MG/ML SOLN
100.0000 mL | Freq: Once | INTRAVENOUS | Status: AC | PRN
  Administered 2015-09-27: 100 mL via INTRAVENOUS

## 2015-09-27 NOTE — ED Provider Notes (Signed)
CSN: [id number]     Arrival date & time 09/27/15  1429 History   First MD Initiated Contact with Patient 09/27/15 1508     Chief Complaint  Patient presents with  . Neck Pain     (Consider location/radiation/quality/duration/timing/severity/associated sxs/prior Treatment) HPI Patient poor she's had a sharp local pain in the left side of her neck. She indicates the area just anterior to the sternocleidomastoid and at the angle of the mandible. She reports that she has developed some hoarseness in her voice. Her physician and initially thought this was a muscle strain and treated her with steroids and muscle relaxers. She reports it is not any better and she does not think that is what the problem is because she can turn or move her neck anyway she wants to. She reports that the pain is made worse if she lies on her left side. She has no pain in the back of the neck. She denies it increases or changes or pain to swallow or breathe. She has not had fever chills or sinus symptoms. She reports now over about the past 3 day she started to develop blurring of her vision. She denies she's had double vision but has seemed blurred. She denies she's had focal loss of function of any upper or lower extremity. He denies generalized headache or fever or chills. She's never had similar pain and states that it was not brought on by any particular activity or move. She was seen by her doctor today and referred to the emergency department for further evaluation. Past Medical History  Diagnosis Date  . Anxiety   . Depression   . Alcoholic (HCC)   . Drug addiction in remission (HCC)   . Bipolar 1 disorder (HCC)   . PTSD (post-traumatic stress disorder)   . Seizures (HCC)     started in Dec 2015 - states last seizure 12/08/14 - has not seen neurologist   . H/O suicide attempt 2005    overdose  . Asthma    Past Surgical History  Procedure Laterality Date  . Cholecystectomy    . Tonsillectomy    . Tubal  ligation     Family History  Problem Relation Age of Onset  . Bipolar disorder Father    Social History  Substance Use Topics  . Smoking status: Former Games developer  . Smokeless tobacco: Never Used  . Alcohol Use: No   OB History    No data available     Review of Systems 10 Systems reviewed and are negative for acute change except as noted in the HPI.    Allergies  Morphine and related and Seroquel  Home Medications   Prior to Admission medications   Medication Sig Start Date End Date Taking? Authorizing Provider  ARIPiprazole (ABILIFY PO) Take by mouth.   Yes Historical Provider, MD  gabapentin (NEURONTIN) 300 MG capsule Take 2 capsules (600 mg total) by mouth daily. Take 2 capsules (600 mg) in the morning.  Take 4 capsules (1200 mg) in the evening. 06/08/15   Adonis Brook, NP  hydrOXYzine (ATARAX/VISTARIL) 50 MG tablet Take 1-2 tablets (50-100 mg total) by mouth 3 (three) times daily as needed for anxiety. 06/08/15   Adonis Brook, NP  prazosin (MINIPRESS) 2 MG capsule Take 1 capsule (2 mg total) by mouth at bedtime. 06/08/15   Adonis Brook, NP   BP 140/97 mmHg  Pulse 74  Temp(Src) 98.2 F (36.8 C) (Oral)  Resp 18  Ht  (1.626 m)  Wt 210 lb (95.255 kg)  BMI 36.03 kg/m2  SpO2 97%  LMP 09/10/2015 Physical Exam  Constitutional: She is oriented to person, place, and time. She appears well-developed and well-nourished.  HENT:  Head: Normocephalic and atraumatic.  Right Ear: External ear normal.  Left Ear: External ear normal.  Bilateral TMs are normal. There is no mass or fullness along the mandible or at any of the facial contours. Nares are patent. Dentition is in good condition without any intraoral lesions. Posterior oropharynx has some asymmetric scarring of the tonsillar pillars but no exudative pharyngitis or tonsillar hypertrophy. The patient's voice is perceptibly hoarse. Cervical palpation does not reveal lymphadenopathy or localizing mass. Patient's tenderness  located over the anterior aspect of the sternocleidomastoid over the carotid bundle. The pulses are symmetric and there is no bruit present. No palpable thyromegaly.  Eyes: EOM are normal. Pupils are equal, round, and reactive to light.  Neck: Neck supple.  Cardiovascular: Normal rate, regular rhythm, normal heart sounds and intact distal pulses.   Pulmonary/Chest: Effort normal and breath sounds normal.  Abdominal: Soft. Bowel sounds are normal. She exhibits no distension. There is no tenderness.  Musculoskeletal: Normal range of motion. She exhibits no edema.  Neurological: She is alert and oriented to person, place, and time. She has normal strength. No cranial nerve deficit. She exhibits normal muscle tone. Coordination normal. GCS eye subscore is 4. GCS verbal subscore is 5. GCS motor subscore is 6.  Skin: Skin is warm, dry and intact.  Psychiatric: She has a normal mood and affect.    ED Course  Procedures (including critical care time) Labs Review Labs Reviewed  BASIC METABOLIC PANEL - Abnormal; Notable for the following:    Glucose, Bld 201 (*)    Calcium 8.7 (*)    All other components within normal limits  CBC WITH DIFFERENTIAL/PLATELET - Abnormal; Notable for the following:    WBC 15.8 (*)    Neutro Abs 12.8 (*)    All other components within normal limits  URINALYSIS, ROUTINE W REFLEX MICROSCOPIC (NOT AT [facility]) - Abnormal; Notable for the following:    APPearance CLOUDY (*)    Glucose, UA >1000 (*)    Nitrite POSITIVE (*)    All other components within normal limits  URINE MICROSCOPIC-ADD ON - Abnormal; Notable for the following:    Bacteria, UA MANY (*)    All other components within normal limits  PREGNANCY, URINE  TSH  T3, FREE    Imaging Review Ct Angio Neck W/cm &/or Wo/cm  09/27/2015  CLINICAL DATA:  Left-sided neck pain for 2 weeks. Increased pain and blurred vision for the past 3 days. EXAM: CT ANGIOGRAPHY NECK TECHNIQUE: Multidetector CT imaging of the neck  was performed using the standard protocol during bolus administration of intravenous contrast. Multiplanar CT image reconstructions and MIPs were obtained to evaluate the vascular anatomy. Carotid stenosis measurements (when applicable) are obtained utilizing NASCET criteria, using the distal internal carotid diameter as the denominator. CONTRAST:  OMNIPAQUE IOHEXOL 350 MG/ML SOLN COMPARISON:  None. FINDINGS: Aortic arch: 3 vessel aortic arch. Brachiocephalic and subclavian arteries are widely patent. Right carotid system: Widely patent without evidence of stenosis, dissection, or aneurysm. Left carotid system: Widely patent without evidence of stenosis, dissection, or aneurysm. Mildly tortuous distal cervical ICA. Vertebral arteries:Patent with the left being mildly dominant. No stenosis or dissection identified, although the right V1 segment is nearly completely obscured by adjacent dense venous contrast. Skeleton: Single level disc degeneration at C5-6  with advanced disc space narrowing and diffuse osteophytic endplate ridging likely resulting in mild spinal stenosis. Other neck: No mass. IMPRESSION: 1. No significant arterial abnormality identified in the neck. Proximal right vertebral artery incompletely evaluated due to adjacent venous contrast. 2. C5-6 disc degeneration with likely mild spinal stenosis. Electronically Signed   By: Sebastian AcheAllen  Grady M.D.   On: 09/27/2015 16:12   I have personally reviewed and evaluated these images and lab results as part of my medical decision-making.   EKG Interpretation None      MDM   Final diagnoses:  Neck pain  Blurred vision  Hoarseness or changing voice   Patient is eating and drinking normally. She has no associated respiratory compromise. The horse quality of the patient's voice is very mild. At this time, CT scan has ruled out vascular anomaly or soft tissue mass. The patient is advised she will need probable ENT follow-up for further diagnostic  study if symptoms of hoarseness are persisting. She has no other neurologic symptoms of gait dysfunction, extremity weakness or numbness. She reports some mild blurring of her vision but she has no double vision or cranial nerve dysfunction. At this time I feel she is safe for continued outpatient evaluation and return if worsening or changing symptoms.    Arby BarretteMarcy Navada Osterhout, MD 09/27/15 [phone]

## 2015-09-27 NOTE — ED Notes (Signed)
C/o pain to left side of neck x 2 weeks-was seen by PCP and started on steroids and muscle relaxers for muscle strain-now c/o blurred vision x 3 days-was advised by PCP to come to ED-pt NAD with steady gait

## 2015-09-27 NOTE — Discharge Instructions (Signed)
Hoarseness Since your symptoms have been persisting, discuss referral to an Ear, Nose, Throat specialist with your family doctor. Hoarseness is any abnormal change in your voice.Hoarseness can make it difficult to speak. Your voice may sound raspy, breathy, or strained. Hoarseness is caused by a problem with the vocal cords. The vocal cords are two bands of tissue inside your voice box (larynx). When you speak, your vocal cords move back and forth to create sound. The surfaces of your vocal cords need to be smooth for your voice to sound clear. Swelling or lumps on the vocal cords can cause hoarseness. Common causes of vocal cord problems include:  Upper airway infection.  A long-term cough.  Straining or overusing your voice.  Smoking.  Allergies.  Vocal cord growths.  Stomach acids that flow up from your stomach and irritate your vocal cords (gastroesophageal reflux). HOME CARE INSTRUCTIONS Watch your condition for any changes. To ease any discomfort that you feel:  Rest your voice. Do not whisper. Whispering can cause muscle strain.  Do not speak in a loud or harsh voice that makes your hoarseness worse.  Do not use any tobacco products, including cigarettes, chewing tobacco, or electronic cigarettes. If you need help quitting, ask your health care provider.  Avoid secondhand smoke.  Do not eat foods that give you heartburn. Heartburn can make gastroesophageal reflux worse.  Do not drink coffee.  Do not drink alcohol.  Drink enough fluids to keep your urine clear or pale yellow.  Use a humidifier if the air in your home is dry. SEEK MEDICAL CARE IF:  You have hoarseness that lasts longer than 3 weeks.  You almost lose or completelylose your voice for longer than 3 days.  You have pain when you swallow or try to talk.  You feel a lump in your neck. SEEK IMMEDIATE MEDICAL CARE IF:  You have trouble swallowing.  You feel as though you are choking when you  swallow.  You cough up blood or vomit blood.  You have trouble breathing.   This information is not intended to replace advice given to you by your health care provider. Make sure you discuss any questions you have with your health care provider.   Document Released: 11/01/2005 Document Revised: 04/04/2015 Document Reviewed: 11/09/2014 Elsevier Interactive Patient Education [employer]2016 Elsevier Inc.

## 2015-09-28 LAB — T3, FREE: T3 FREE: 2.4 pg/mL (ref 2.0–4.4)

## 2015-10-02 ENCOUNTER — Emergency Department (HOSPITAL_BASED_OUTPATIENT_CLINIC_OR_DEPARTMENT_OTHER): Payer: 59

## 2015-10-02 ENCOUNTER — Emergency Department (HOSPITAL_BASED_OUTPATIENT_CLINIC_OR_DEPARTMENT_OTHER)
Admission: EM | Admit: 2015-10-02 | Discharge: 2015-10-02 | Disposition: A | Discharge status: Home / Self Care | Payer: 59 | Attending: Emergency Medicine | Admitting: Emergency Medicine

## 2015-10-02 ENCOUNTER — Encounter (HOSPITAL_BASED_OUTPATIENT_CLINIC_OR_DEPARTMENT_OTHER): Payer: Self-pay | Admitting: *Deleted

## 2015-10-02 DIAGNOSIS — F319 Bipolar disorder, unspecified: Secondary | ICD-10-CM | POA: Insufficient documentation

## 2015-10-02 DIAGNOSIS — G40909 Epilepsy, unspecified, not intractable, without status epilepticus: Secondary | ICD-10-CM | POA: Insufficient documentation

## 2015-10-02 DIAGNOSIS — F419 Anxiety disorder, unspecified: Secondary | ICD-10-CM | POA: Insufficient documentation

## 2015-10-02 DIAGNOSIS — Z79899 Other long term (current) drug therapy: Secondary | ICD-10-CM | POA: Insufficient documentation

## 2015-10-02 DIAGNOSIS — Z87891 Personal history of nicotine dependence: Secondary | ICD-10-CM | POA: Insufficient documentation

## 2015-10-02 DIAGNOSIS — H539 Unspecified visual disturbance: Secondary | ICD-10-CM

## 2015-10-02 DIAGNOSIS — M542 Cervicalgia: Secondary | ICD-10-CM | POA: Insufficient documentation

## 2015-10-02 DIAGNOSIS — F4310 Post-traumatic stress disorder, unspecified: Secondary | ICD-10-CM | POA: Insufficient documentation

## 2015-10-02 DIAGNOSIS — J45909 Unspecified asthma, uncomplicated: Secondary | ICD-10-CM | POA: Insufficient documentation

## 2015-10-02 DIAGNOSIS — Z915 Personal history of self-harm: Secondary | ICD-10-CM | POA: Insufficient documentation

## 2015-10-02 DIAGNOSIS — H538 Other visual disturbances: Secondary | ICD-10-CM | POA: Insufficient documentation

## 2015-10-02 LAB — COMPREHENSIVE METABOLIC PANEL
ALBUMIN: 3.3 g/dL — AB (ref 3.5–5.0)
ALK PHOS: 54 U/L (ref 38–126)
ALT: 35 U/L (ref 14–54)
AST: 21 U/L (ref 15–41)
Anion gap: 7 (ref 5–15)
BILIRUBIN TOTAL: 0.5 mg/dL (ref 0.3–1.2)
BUN: 13 mg/dL (ref 6–20)
CALCIUM: 8.4 mg/dL — AB (ref 8.9–10.3)
CO2: 25 mmol/L (ref 22–32)
Chloride: 108 mmol/L (ref 101–111)
Creatinine, Ser: 0.95 mg/dL (ref 0.44–1.00)
GFR calc Af Amer: >60 mL/min (ref >60)
GFR calc non Af Amer: >60 mL/min (ref >60)
GLUCOSE: 98 mg/dL (ref 65–99)
POTASSIUM: 3.9 mmol/L (ref 3.5–5.1)
Sodium: 140 mmol/L (ref 135–145)
TOTAL PROTEIN: 5.9 g/dL — AB (ref 6.5–8.1)

## 2015-10-02 LAB — TROPONIN I: Troponin I: <0.03 ng/mL (ref <0.031)

## 2015-10-02 MED ORDER — TRAMADOL HCL 50 MG PO TABS: 50.0000 mg | ORAL_TABLET | Freq: Four times a day (QID) | ORAL | Status: DC | PRN

## 2015-10-02 MED ORDER — TRAMADOL HCL 50 MG PO TABS
50.0000 mg | ORAL_TABLET | Freq: Once | ORAL | Status: AC
  Administered 2015-10-02: 50 mg via ORAL
  Filled 2015-10-02: qty 1

## 2015-10-02 NOTE — ED Notes (Signed)
Dr. Judd Lienelo in to see pt, discuss results and d/c plan

## 2015-10-02 NOTE — Discharge Instructions (Signed)
Tramadol as prescribed as needed for pain.  Follow-up with your primary Dr. or ENT if not improving in the next few days.   Pain Without a Known Cause WHAT IS PAIN WITHOUT A KNOWN CAUSE? Pain can occur in any part of the body and can range from mild to severe. Sometimes no cause can be found for why you are having pain. Some types of pain that can occur without a known cause include:   Headache.  Back pain.  Abdominal pain.  Neck pain. HOW IS PAIN WITHOUT A KNOWN CAUSE DIAGNOSED?  Your health care provider will try to find the cause of your pain. This may include:  Physical exam.  Medical history.  Blood tests.  Urine tests.  X-rays. If no cause is found, your health care provider may diagnose you with pain without a known cause.  IS THERE TREATMENT FOR PAIN WITHOUT A CAUSE?  Treatment depends on the kind of pain you have. Your health care provider may prescribe medicines to help relieve your pain.  WHAT CAN I DO AT HOME FOR MY PAIN?   Take medicines only as directed by your health care provider.  Stop any activities that cause pain. During periods of severe pain, bed rest may help.  Try to reduce your stress with activities such as yoga or meditation. Talk to your health care provider for other stress-reducing activity recommendations.  Exercise regularly, if approved by your health care provider.  Eat a healthy diet that includes fruits and vegetables. This may improve pain. Talk to your health care provider if you have any questions about your diet. WHAT IF MY PAIN DOES NOT GET BETTER?  If you have a painful condition and no reason can be found for the pain or the pain gets worse, it is important to follow up with your health care provider. It may be necessary to repeat tests and look further for a possible cause.    This information is not intended to replace advice given to you by your health care provider. Make sure you discuss any questions you have with your  health care provider.   Document Released: 08/13/2001 Document Revised: 12/09/2014 Document Reviewed: 04/05/2014 Elsevier Interactive Patient Education [employer]2016 Elsevier Inc.

## 2015-10-02 NOTE — ED Notes (Signed)
C/o L neck pain, relates pain to glands, onset 13d ago, seen by ENT ([place]. Shelva [place]) and here in ED once this week. Taking ibuprofen, muscle relaxant and amoxicillin w/o improvement, also alternating ice and heat, finished steroid dose pack. also reports some dizziness. (denies: fever, nvd, earache, dental pain, CP, sob, cough, congestion or cold sx). Last ibuprofen at 0000MN. [facility] 10/10[facility]

## 2015-10-02 NOTE — ED Provider Notes (Signed)
CSN: [id number]     Arrival date & time 10/02/15  0116 History   First MD Initiated Contact with Patient 10/02/15 0211     Chief Complaint  Patient presents with  . Neck Pain     (Consider location/radiation/quality/duration/timing/severity/associated sxs/prior Treatment) HPI Comments: Patient is a 39 year old female with history of anxiety, depression, alcoholism, bipolar, PTSD, seizures. She presents for evaluation of left-sided neck pain. She has had this pain for the past 2 weeks. She has been evaluated here previously and also seen by ENT. No cause has been found and she is not feeling any better despite antibiotics and anti-inflammatory's. She returns tonight complaining that her pain is not any better. She denies any fevers or chills. She denies any sore throat. She does report that her vision is now blurry. She states she could read her cell phone fine several days ago, but now has to wear her glasses in order to see the numbers.  [name] presenting with neck pain. The history is provided by the patient.  Neck Pain Pain location:  L side Quality:  Stabbing Pain radiates to:  Does not radiate Pain severity:  Moderate Duration:  2 weeks Timing:  Constant Progression:  Unchanged Chronicity:  New Relieved by:  None tried Worsened by:  Nothing tried Ineffective treatments:  NSAIDs   Past Medical History  Diagnosis Date  . Anxiety   . Depression   . Alcoholic (HCC)   . Drug addiction in remission (HCC)   . Bipolar 1 disorder (HCC)   . PTSD (post-traumatic stress disorder)   . Seizures (HCC)     started in Dec 2015 - states last seizure 12/08/14 - has not seen neurologist   . H/O suicide attempt 2005    overdose  . Asthma    Past Surgical History  Procedure Laterality Date  . Cholecystectomy    . Tonsillectomy    . Tubal ligation     Family History  Problem Relation Age of Onset  . Bipolar disorder Father    Social History  Substance Use  Topics  . Smoking status: Former Games developermoker  . Smokeless tobacco: Never Used  . Alcohol Use: No   OB History    No data available     Review of Systems  Musculoskeletal: Positive for neck pain.  All other systems reviewed and are negative.     Allergies  Morphine and related and Seroquel  Home Medications   Prior to Admission medications   Medication Sig Start Date End Date Taking? Authorizing Provider  ARIPiprazole (ABILIFY PO) Take by mouth.    Historical Provider, MD  gabapentin (NEURONTIN) 300 MG capsule Take 2 capsules (600 mg total) by mouth daily. Take 2 capsules (600 mg) in the morning.  Take 4 capsules (1200 mg) in the evening. 06/08/15   Adonis BrookSheila Agustin, NP  hydrOXYzine (ATARAX/VISTARIL) 50 MG tablet Take 1-2 tablets (50-100 mg total) by mouth 3 (three) times daily as needed for anxiety. 06/08/15   Adonis BrookSheila Agustin, NP  prazosin (MINIPRESS) 2 MG capsule Take 1 capsule (2 mg total) by mouth at bedtime. 06/08/15   Adonis BrookSheila Agustin, NP   BP 149/106 mmHg  Pulse 105  Temp(Src) 98.6 F (37 C) (Oral)  Resp 18  Ht 5\' 4"  (1.626 m)  Wt 210 lb (95.255 kg)  BMI 36.03 kg/m2  SpO2 97%  LMP 09/10/2015 Physical Exam  Constitutional: She is oriented to person, place, and time. She appears well-developed and well-nourished. No distress.  HENT:  Head: Normocephalic and atraumatic.  Eyes: EOM are normal. Pupils are equal, round, and reactive to light.  Funduscopic exam is within normal limits.  Neck: Normal range of motion. Neck supple.  I am unable to palpate any abnormal findings in the neck. There are no lumps, bumps, or masses.  Cardiovascular: Normal rate and regular rhythm.  Exam reveals no gallop and no friction rub.   No murmur heard. Pulmonary/Chest: Effort normal and breath sounds normal. No respiratory distress. She has no wheezes.  Abdominal: Soft. Bowel sounds are normal. She exhibits no distension. There is no tenderness.  Musculoskeletal: Normal range of motion.   Neurological: She is alert and oriented to person, place, and time. No cranial nerve deficit. She exhibits normal muscle tone. Coordination normal.  Skin: Skin is warm and dry. She is not diaphoretic.  Nursing note and vitals reviewed.   ED Course  Procedures (including critical care time) Labs Review Labs Reviewed  COMPREHENSIVE METABOLIC PANEL  BASIC METABOLIC PANEL  TROPONIN I    Imaging Review No results found. I have personally reviewed and evaluated these images and lab results as part of my medical decision-making.   EKG Interpretation   Date/Time:  Monday October 02 2015 02:29:55 EDT Ventricular Rate:  88 PR Interval:  150 QRS Duration: 86 QT Interval:  354 QTC Calculation: 428 R Axis:   72 Text Interpretation:  Normal sinus rhythm Normal ECG Confirmed by Pa Tennant   MD, Tray Klayman ([id number]) on 10/02/2015 3:01:09 AM      MDM   Final diagnoses:  None    Patient presents here with complaints in the left side of her neck. She was seen here 4 days ago for similar complaints. She had a CT scan performed which was negative and was referred to ENT. She was seen there and was told she may have an infected salivary gland. She was prescribed antibiotics and discharged, however she is not improving. Her physical examination is unremarkable. I am unable to palpate any abnormality in the neck. There is no swelling or redness and she has full range of motion. She is now complaining also of blurry vision. She is neurologically intact and her CT scan is negative. Laboratory studies today are unremarkable including troponin. Her EKG is normal.  I find no evidence for anything emergent. She is to follow-up with her ENT in the next few days if not improving. I will prescribe tramadol which she can take as needed for pain in the meantime.    Geoffery Lyons, MD 10/02/15 [phone]

## 2015-10-25 ENCOUNTER — Emergency Department (HOSPITAL_COMMUNITY)
Admission: EM | Admit: 2015-10-25 | Discharge: 2015-10-25 | Disposition: A | Discharge status: Home / Self Care | Payer: 59 | Attending: Emergency Medicine | Admitting: Emergency Medicine

## 2015-10-25 ENCOUNTER — Encounter (HOSPITAL_COMMUNITY): Payer: Self-pay | Admitting: Emergency Medicine

## 2015-10-25 DIAGNOSIS — R112 Nausea with vomiting, unspecified: Secondary | ICD-10-CM | POA: Diagnosis not present

## 2015-10-25 DIAGNOSIS — R569 Unspecified convulsions: Secondary | ICD-10-CM | POA: Diagnosis not present

## 2015-10-25 DIAGNOSIS — F419 Anxiety disorder, unspecified: Secondary | ICD-10-CM | POA: Diagnosis not present

## 2015-10-25 DIAGNOSIS — F329 Major depressive disorder, single episode, unspecified: Secondary | ICD-10-CM | POA: Diagnosis not present

## 2015-10-25 DIAGNOSIS — R509 Fever, unspecified: Secondary | ICD-10-CM | POA: Diagnosis not present

## 2015-10-25 DIAGNOSIS — R197 Diarrhea, unspecified: Secondary | ICD-10-CM | POA: Diagnosis not present

## 2015-10-25 DIAGNOSIS — R6884 Jaw pain: Secondary | ICD-10-CM | POA: Diagnosis present

## 2015-10-25 DIAGNOSIS — Z79899 Other long term (current) drug therapy: Secondary | ICD-10-CM | POA: Diagnosis not present

## 2015-10-25 DIAGNOSIS — J45909 Unspecified asthma, uncomplicated: Secondary | ICD-10-CM | POA: Diagnosis not present

## 2015-10-25 DIAGNOSIS — Z87891 Personal history of nicotine dependence: Secondary | ICD-10-CM | POA: Diagnosis not present

## 2015-10-25 DIAGNOSIS — Z915 Personal history of self-harm: Secondary | ICD-10-CM | POA: Insufficient documentation

## 2015-10-25 DIAGNOSIS — M542 Cervicalgia: Secondary | ICD-10-CM

## 2015-10-25 MED ORDER — MELOXICAM 7.5 MG PO TABS: 7.5000 mg | ORAL_TABLET | Freq: Every day | ORAL | Status: DC

## 2015-10-25 MED ORDER — TIZANIDINE HCL 4 MG PO TABS: 4.0000 mg | ORAL_TABLET | Freq: Four times a day (QID) | ORAL | Status: DC | PRN

## 2015-10-25 NOTE — Discharge Instructions (Signed)
Read the information below.  Use the prescribed medication as directed.  Please discuss all new medications with your pharmacist.  You may return to the Emergency Department at any time for worsening condition or any new symptoms that concern you.    If there is any possibility that you might be pregnant, please let your health care provider know and discuss this with the pharmacist to ensure medication safety.    If you develop high fevers, difficulty swallowing or breathing, or you are unable to tolerate fluids by mouth, return to the ER immediately for a recheck.      Musculoskeletal Pain Musculoskeletal pain is muscle and boney aches and pains. These pains can occur in any part of the body. Your caregiver may treat you without knowing the cause of the pain. They may treat you if blood or urine tests, X-rays, and other tests were normal.  CAUSES There is often not a definite cause or reason for these pains. These pains may be caused by a type of germ (virus). The discomfort may also come from overuse. Overuse includes working out too hard when your body is not fit. Boney aches also come from weather changes. Bone is sensitive to atmospheric pressure changes. HOME CARE INSTRUCTIONS   Ask when your test results will be ready. Make sure you get your test results.  Only take over-the-counter or prescription medicines for pain, discomfort, or fever as directed by your caregiver. If you were given medications for your condition, do not drive, operate machinery or power tools, or sign legal documents for 24 hours. Do not drink alcohol. Do not take sleeping pills or other medications that may interfere with treatment.  Continue all activities unless the activities cause more pain. When the pain lessens, slowly resume normal activities. Gradually increase the intensity and duration of the activities or exercise.  During periods of severe pain, bed rest may be helpful. Lay or sit in any position that is  comfortable.  Putting ice on the injured area.  Put ice in a bag.  Place a towel between your skin and the bag.  Leave the ice on for 15 to 20 minutes, 3 to 4 times a day.  Follow up with your caregiver for continued problems and no reason can be found for the pain. If the pain becomes worse or does not go away, it may be necessary to repeat tests or do additional testing. Your caregiver may need to look further for a possible cause. SEEK IMMEDIATE MEDICAL CARE IF:  You have pain that is getting worse and is not relieved by medications.  You develop chest pain that is associated with shortness or breath, sweating, feeling sick to your stomach (nauseous), or throw up (vomit).  Your pain becomes localized to the abdomen.  You develop any new symptoms that seem different or that concern you. MAKE SURE YOU:   Understand these instructions.  Will watch your condition.  Will get help right away if you are not doing well or get worse.   This information is not intended to replace advice given to you by your health care provider. Make sure you discuss any questions you have with your health care provider.   Document Released: 11/18/2005 Document Revised: 02/10/2012 Document Reviewed: 07/23/2013 Elsevier Interactive Patient Education [employer]2016 Elsevier Inc.

## 2015-10-25 NOTE — ED Provider Notes (Signed)
CSN: [id number]     Arrival date & time 10/25/15  7564 History  By signing my name below, I, Freida Busman, attest that this documentation has been prepared under the direction and in the presence of non-physician practitioner, Trixie Dredge, PA-C. Electronically Signed: Freida Busman, Scribe. 10/25/2015. 10:12 AM.     Chief Complaint  Patient presents with  . Jaw Pain  . Emesis    The history is provided by the patient. No language interpreter was used.   HPI Comments:  Loretta Hood is a 39 y.o. female who presents to the Emergency Department complaining of stabbing pain to left neck/jaw for one month. She has been evaluated by ENT, advised she may have an infection in her lymph nodes. She was also seen by PCP and thought to have salivary gland infection. She has also seen a dentist and had wisdom teeth pulled on 10/20/15. She has been placed on antibiotics- cephalexin and amoxicillin- which has provided no relief of her neck/jaw pain. The pain is constantly present, described as sharp and stabbing.  She has tried multiple medications without relief.  She has also been evaluated in the ED for the same pain and was discharged with tramadol. Pt notes her symptoms are causing her to feel depressed. She denies SI. Pt admits to taking an extra gabapentin tablet yesterday without relief.   She also complains of nausea, vomiting ~3 episodes, and diarrhea which began yesterday and intermittent fever which she has been taking tylenol and ibuprofen for with relief. She notes fever of 100.3 this AM. Pt denies CP, abdominal pain and ear pain. No alleviating factors noted.  Past Medical History  Diagnosis Date  . Anxiety   . Depression   . Alcoholic (HCC)   . Drug addiction in remission (HCC)   . Bipolar 1 disorder (HCC)   . PTSD (post-traumatic stress disorder)   . Seizures (HCC)     started in Dec 2015 - states last seizure 12/08/14 - has not seen neurologist   . H/O suicide attempt 2005     overdose  . Asthma    Past Surgical History  Procedure Laterality Date  . Cholecystectomy    . Tonsillectomy    . Tubal ligation     Family History  Problem Relation Age of Onset  . Bipolar disorder Father    Social History  Substance Use Topics  . Smoking status: Former Games developer  . Smokeless tobacco: Never Used  . Alcohol Use: No   OB History    No data available     Review of Systems  Constitutional: Positive for fever.  HENT: Negative for dental problem, drooling, ear discharge, ear pain, facial swelling, sore throat and trouble swallowing.   Respiratory: Negative for shortness of breath.   Cardiovascular: Negative for chest pain.  Gastrointestinal: Positive for nausea, vomiting and diarrhea. Negative for abdominal pain.  Musculoskeletal: Positive for neck pain (and jaw pain).  Allergic/Immunologic: Negative for immunocompromised state.  Hematological: Does not bruise/bleed easily.  Psychiatric/Behavioral: Negative for suicidal ideas and self-injury.    Allergies  Morphine and related and Seroquel  Home Medications   Prior to Admission medications   Medication Sig Start Date End Date Taking? Authorizing Provider  ARIPiprazole (ABILIFY PO) Take by mouth.    Historical Provider, MD  gabapentin (NEURONTIN) 300 MG capsule Take 2 capsules (600 mg total) by mouth daily. Take 2 capsules (600 mg) in the morning.  Take 4 capsules (1200 mg) in the evening. 06/08/15  Adonis BrookSheila Agustin, NP  hydrOXYzine (ATARAX/VISTARIL) 50 MG tablet Take 1-2 tablets (50-100 mg total) by mouth 3 (three) times daily as needed for anxiety. 06/08/15   Adonis BrookSheila Agustin, NP  prazosin (MINIPRESS) 2 MG capsule Take 1 capsule (2 mg total) by mouth at bedtime. 06/08/15   Adonis BrookSheila Agustin, NP  traMADol (ULTRAM) 50 MG tablet Take 1 tablet (50 mg total) by mouth every 6 (six) hours as needed. 10/02/15   Geoffery Lyonsouglas Delo, MD   BP 147/87 mmHg  Pulse 91  Temp(Src) 98 F (36.7 C) (Oral)  Resp 20  SpO2 97%  LMP  10/11/2015 Physical Exam  Constitutional: She appears well-developed and well-nourished. No distress.  HENT:  Head: Normocephalic and atraumatic.  Right Ear: Tympanic membrane and ear canal normal.  Left Ear: Tympanic membrane and ear canal normal.  Mouth/Throat: Oropharynx is clear and moist. No oropharyngeal exudate.  Recent removal of all 4 third molars without erythema, edema, or discharge   Eyes: Conjunctivae are normal.  Neck: Normal range of motion. Neck supple. No tracheal deviation present. No thyromegaly present.  Tenderness to the deep soft spaces under left mandible No  bony tenderness No lymphadenopathy No overlying skin changes  No masses palpated    Cardiovascular: Normal rate and regular rhythm.   Pulmonary/Chest: Effort normal and breath sounds normal. No stridor. No respiratory distress. She has no wheezes. She has no rales.  Lymphadenopathy:    She has no cervical adenopathy.  Neurological: She is alert.  Skin: No rash noted. She is not diaphoretic. No erythema.  Nursing note and vitals reviewed.   ED Course  Procedures   DIAGNOSTIC STUDIES:  Oxygen Saturation is 97% on RA, normal by my interpretation.    COORDINATION OF CARE:  10:04 AM Advised pt to follow up with PCP. Discussed treatment plan with pt at bedside and pt agreed to plan.  Pt has received 3 RXs for vicodin in November 2016 from different providers 11/6: 30 Vicodin 5-325 11/14:  12 vicodin 5-325 11/18:  15 vicodin 5-325 Pt had CTA of neck on 10/26 without significant abnormalities and CT head 10/02/15 normal    MDM   Final diagnoses:  Neck pain    Afebrile, nontoxic patient with ongoing left submandibular pain with a normal exam with exception of tenderness of soft tissue space.  She has seen PCP, ENT, dentist, and this is her 3rd ED visit.  She recently had a negative CT angio of her neck for same.   D/C home with mobic, zanaflex, PCP follow up. Discussed result, findings, treatment,  and follow up  with patient.  Pt given return precautions.  Pt verbalizes understanding and agrees with plan.       I personally performed the services described in this documentation, which was scribed in my presence. The recorded information has been reviewed and is accurate.    Trixie Dredgemily Kyshon Tolliver, PA-C 10/25/15 1330  Margarita Grizzleanielle Ray, MD 10/25/15 [phone]

## 2015-10-25 NOTE — ED Notes (Signed)
C/o abd discomfort andf N/V yesterday. Also c/o stabbing pain left jaw.

## 2016-08-15 ENCOUNTER — Encounter (HOSPITAL_BASED_OUTPATIENT_CLINIC_OR_DEPARTMENT_OTHER): Payer: Self-pay | Admitting: *Deleted

## 2016-08-15 ENCOUNTER — Emergency Department (HOSPITAL_BASED_OUTPATIENT_CLINIC_OR_DEPARTMENT_OTHER)
Admission: EM | Admit: 2016-08-15 | Discharge: 2016-08-15 | Disposition: A | Discharge status: Home / Self Care | Payer: Self-pay | Attending: Emergency Medicine | Admitting: Emergency Medicine

## 2016-08-15 DIAGNOSIS — R112 Nausea with vomiting, unspecified: Secondary | ICD-10-CM | POA: Insufficient documentation

## 2016-08-15 DIAGNOSIS — Z87891 Personal history of nicotine dependence: Secondary | ICD-10-CM | POA: Insufficient documentation

## 2016-08-15 DIAGNOSIS — R197 Diarrhea, unspecified: Secondary | ICD-10-CM | POA: Insufficient documentation

## 2016-08-15 DIAGNOSIS — J45909 Unspecified asthma, uncomplicated: Secondary | ICD-10-CM | POA: Insufficient documentation

## 2016-08-15 LAB — COMPREHENSIVE METABOLIC PANEL
ALK PHOS: 65 U/L (ref 38–126)
ALT: 24 U/L (ref 14–54)
AST: 16 U/L (ref 15–41)
Albumin: 3.8 g/dL (ref 3.5–5.0)
Anion gap: 6 (ref 5–15)
BILIRUBIN TOTAL: 0.6 mg/dL (ref 0.3–1.2)
BUN: 11 mg/dL (ref 6–20)
CALCIUM: 8.8 mg/dL — AB (ref 8.9–10.3)
CO2: 26 mmol/L (ref 22–32)
Chloride: 106 mmol/L (ref 101–111)
Creatinine, Ser: 0.87 mg/dL (ref 0.44–1.00)
Glucose, Bld: 93 mg/dL (ref 65–99)
Potassium: 3.8 mmol/L (ref 3.5–5.1)
Sodium: 138 mmol/L (ref 135–145)
TOTAL PROTEIN: 6.6 g/dL (ref 6.5–8.1)

## 2016-08-15 LAB — CBC WITH DIFFERENTIAL/PLATELET
BASOS ABS: 0.1 10*3/uL (ref 0.0–0.1)
Basophils Relative: 1 %
Eosinophils Absolute: 0.2 10*3/uL (ref 0.0–0.7)
Eosinophils Relative: 2 %
HEMATOCRIT: 42 % (ref 36.0–46.0)
Hemoglobin: 14.1 g/dL (ref 12.0–15.0)
LYMPHS ABS: 2.9 10*3/uL (ref 0.7–4.0)
LYMPHS PCT: 25 %
MCH: 30.1 pg (ref 26.0–34.0)
MCHC: 33.6 g/dL (ref 30.0–36.0)
MCV: 89.6 fL (ref 78.0–100.0)
Monocytes Absolute: 0.9 10*3/uL (ref 0.1–1.0)
Monocytes Relative: 7 %
NEUTROS ABS: 7.8 10*3/uL — AB (ref 1.7–7.7)
Neutrophils Relative %: 65 %
Platelets: 307 10*3/uL (ref 150–400)
RBC: 4.69 MIL/uL (ref 3.87–5.11)
RDW: 12.6 % (ref 11.5–15.5)
WBC: 11.9 10*3/uL — AB (ref 4.0–10.5)

## 2016-08-15 LAB — URINE MICROSCOPIC-ADD ON

## 2016-08-15 LAB — URINALYSIS, ROUTINE W REFLEX MICROSCOPIC
BILIRUBIN URINE: NEGATIVE
GLUCOSE, UA: NEGATIVE mg/dL
Ketones, ur: NEGATIVE mg/dL
Nitrite: POSITIVE — AB
PROTEIN: NEGATIVE mg/dL
SPECIFIC GRAVITY, URINE: 1.023 (ref 1.005–1.030)
pH: 6 (ref 5.0–8.0)

## 2016-08-15 LAB — LIPASE, BLOOD: Lipase: 25 U/L (ref 11–51)

## 2016-08-15 LAB — PREGNANCY, URINE: PREG TEST UR: NEGATIVE

## 2016-08-15 MED ORDER — MORPHINE SULFATE (PF) 2 MG/ML IV SOLN
2.0000 mg | Freq: Once | INTRAVENOUS | Status: AC
  Administered 2016-08-15: 2 mg via INTRAVENOUS
  Filled 2016-08-15: qty 1

## 2016-08-15 MED ORDER — DICYCLOMINE HCL 10 MG PO CAPS: 20.0000 mg | ORAL_CAPSULE | Freq: Four times a day (QID) | ORAL | 0 refills | Status: DC | PRN

## 2016-08-15 MED ORDER — PROMETHAZINE HCL 25 MG PO TABS
25.0000 mg | ORAL_TABLET | Freq: Once | ORAL | Status: AC
  Administered 2016-08-15: 25 mg via ORAL
  Filled 2016-08-15: qty 1

## 2016-08-15 MED ORDER — ONDANSETRON HCL 4 MG/2ML IJ SOLN
4.0000 mg | Freq: Once | INTRAMUSCULAR | Status: AC
Start: 2016-08-15 — End: 2016-08-15
  Administered 2016-08-15: 4 mg via INTRAVENOUS
  Filled 2016-08-15: qty 2

## 2016-08-15 MED ORDER — MAGNESIUM SULFATE 2 GM/50ML IV SOLN
2.0000 g | Freq: Once | INTRAVENOUS | Status: AC
  Administered 2016-08-15: 2 g via INTRAVENOUS
  Filled 2016-08-15: qty 50

## 2016-08-15 MED ORDER — PROMETHAZINE HCL 25 MG PO TABS: 25.0000 mg | ORAL_TABLET | Freq: Four times a day (QID) | ORAL | 0 refills | Status: AC | PRN

## 2016-08-15 MED ORDER — SODIUM CHLORIDE 0.9 % IV BOLUS (SEPSIS)
1000.0000 mL | Freq: Once | INTRAVENOUS | Status: AC
  Administered 2016-08-15: 1000 mL via INTRAVENOUS

## 2016-08-15 MED ORDER — FAMOTIDINE IN NACL 20-0.9 MG/50ML-% IV SOLN
20.0000 mg | Freq: Once | INTRAVENOUS | Status: AC
Start: 2016-08-15 — End: 2016-08-15
  Administered 2016-08-15: 20 mg via INTRAVENOUS
  Filled 2016-08-15: qty 50

## 2016-08-15 MED ORDER — DICYCLOMINE HCL 10 MG PO CAPS
10.0000 mg | ORAL_CAPSULE | Freq: Once | ORAL | Status: AC
  Administered 2016-08-15: 10 mg via ORAL
  Filled 2016-08-15: qty 1

## 2016-08-15 NOTE — ED Notes (Signed)
Patient with walked to room 1, patient undressed into a gown and given a warm blanket.

## 2016-08-15 NOTE — ED Provider Notes (Signed)
[facility] Provider Note   CSN: [id number] Arrival date & time: 08/15/16  1325     History   Chief Complaint Chief Complaint  Patient presents with  . Diarrhea    HPI  Blood pressure 143/95, pulse 65, temperature 98.1 F (36.7 C), resp. rate 18, height 5\' 4"  (1.626 m), weight 86.2 kg, last menstrual period 08/15/2016, SpO2 98 %.  Loretta Hood is a 40 y.o. female complaining of multiple episodes of nonbloody, nonbilious, non-coffee ground emesis associated loose stool onset 4 days ago with epigastric discomfort onset after the vomiting in addition to tactile fever and chills. Reports diffuse myalgia along with reduced urinary output and exacerbation of chronic migraine.   HPI  Past Medical History:  Diagnosis Date  . Alcoholic (HCC)   . Anxiety   . Asthma   . Bipolar 1 disorder (HCC)   . Depression   . Drug addiction in remission (HCC)   . H/O suicide attempt 2005   overdose  . PTSD (post-traumatic stress disorder)   . Seizures (HCC)    started in Dec 2015 - states last seizure 12/08/14 - has not seen neurologist     Patient Active Problem List   Diagnosis Date Noted  . Bipolar disorder, current episode depressed, severe, with psychotic features (HCC) 06/02/2015  . PTSD (post-traumatic stress disorder) 06/02/2015  . Cocaine use disorder, severe, in sustained remission 06/02/2015  . Cannabis use disorder, severe, in sustained remission 06/02/2015  . Opioid use disorder, moderate, in sustained remission 06/02/2015  . Alcohol use disorder, severe, in sustained remission (HCC) 06/02/2015  . DYSPLASIA OF CERVIX UNSPECIFIED 10/02/2009  . FATIGUE 10/02/2009  . MEMORY LOSS 10/02/2009  . NUMBNESS 10/02/2009  . DRUG ABUSE, HX OF 10/02/2009    Past Surgical History:  Procedure Laterality Date  . CHOLECYSTECTOMY    . TONSILLECTOMY    . tubal ligation      OB History    No data available       Home Medications    Prior to Admission medications    Medication Sig Start Date End Date Taking? Authorizing Provider  dicyclomine (BENTYL) 10 MG capsule Take 2 capsules (20 mg total) by mouth 4 (four) times daily as needed for spasms. 08/15/16   Penney Domanski, PA-C  promethazine (PHENERGAN) 25 MG tablet Take 1 tablet (25 mg total) by mouth every 6 (six) hours as needed for nausea or vomiting. 08/15/16   Joni Reining Keivon Garden, PA-C    Family History Family History  Problem Relation Age of Onset  . Bipolar disorder Father     Social History Social History  Substance Use Topics  . Smoking status: Former Games developer  . Smokeless tobacco: Never Used  . Alcohol use No     Allergies   Morphine and related and Seroquel [quetiapine fumarate]   Review of Systems Review of Systems  10 systems reviewed and found to be negative, except as noted in the HPI.    Physical Exam Updated Vital Signs BP 120/82 (BP Location: Left Arm)   Pulse 64   Temp 98.1 F (36.7 C)   Resp 18   Ht 5\' 4"  (1.626 m)   Wt 86.2 kg   LMP 08/15/2016   SpO2 96%   BMI 32.61 kg/m   Physical Exam  Constitutional: She is oriented to person, place, and time. She appears well-developed and well-nourished. No distress.  HENT:  Head: Normocephalic and atraumatic.  Mouth/Throat: Oropharynx is clear and moist.  Eyes: Conjunctivae and EOM  are normal. Pupils are equal, round, and reactive to light.  Neck: Normal range of motion.  Cardiovascular: Normal rate, regular rhythm and intact distal pulses.   Pulmonary/Chest: Effort normal and breath sounds normal.  Abdominal: Soft. There is no tenderness.  Mild, diffuse tenderness to palpation with no guarding or rebound.  Murphy sign negative, no tenderness to palpation over McBurney's point, Rovsings, Psoas and obturator all negative.   Musculoskeletal: Normal range of motion.  Neurological: She is alert and oriented to person, place, and time.  Skin: She is not diaphoretic.  Psychiatric: She has a normal mood and affect.    Nursing note and vitals reviewed.    ED Treatments / Results  Labs (all labs ordered are listed, but only abnormal results are displayed) Labs Reviewed  URINALYSIS, ROUTINE W REFLEX MICROSCOPIC (NOT AT [facility]) - Abnormal; Notable for the following:       Result Value   APPearance CLOUDY (*)    Hgb urine dipstick LARGE (*)    Nitrite POSITIVE (*)    Leukocytes, UA TRACE (*)    All other components within normal limits  URINE MICROSCOPIC-ADD ON - Abnormal; Notable for the following:    Squamous Epithelial / LPF 0-5 (*)    Bacteria, UA MANY (*)    All other components within normal limits  CBC WITH DIFFERENTIAL/PLATELET - Abnormal; Notable for the following:    WBC 11.9 (*)    Neutro Abs 7.8 (*)    All other components within normal limits  COMPREHENSIVE METABOLIC PANEL - Abnormal; Notable for the following:    Calcium 8.8 (*)    All other components within normal limits  PREGNANCY, URINE  LIPASE, BLOOD    EKG  EKG Interpretation None       Radiology No results found.  Procedures Procedures (including critical care time)  Medications Ordered in ED Medications  magnesium sulfate IVPB 2 g 50 mL (2 g Intravenous New Bag/Given 08/15/16 1532)  dicyclomine (BENTYL) capsule 10 mg (not administered)  promethazine (PHENERGAN) tablet 25 mg (not administered)  sodium chloride 0.9 % bolus 1,000 mL (1,000 mLs Intravenous New Bag/Given 08/15/16 1440)  ondansetron (ZOFRAN) injection 4 mg (4 mg Intravenous Given 08/15/16 1448)  famotidine (PEPCID) IVPB 20 mg premix (0 mg Intravenous Stopped 08/15/16 1531)  morphine 2 MG/ML injection 2 mg (2 mg Intravenous Given 08/15/16 1449)     Initial Impression / Assessment and Plan / ED Course  I have reviewed the triage vital signs and the nursing notes.  Pertinent labs & imaging results that were available during my care of the patient were reviewed by me and considered in my medical decision making (see chart for details).  Clinical Course     Vitals:   08/15/16 1328 08/15/16 1329 08/15/16 1550  BP:  143/95 120/82  Pulse:  65 64  Resp:  18 18  Temp:  98.1 F (36.7 C)   SpO2:  98% 96%  Weight: 86.2 kg    Height: 5\' 4"  (1.626 m)      Medications  magnesium sulfate IVPB 2 g 50 mL (2 g Intravenous New Bag/Given 08/15/16 1532)  dicyclomine (BENTYL) capsule 10 mg (not administered)  promethazine (PHENERGAN) tablet 25 mg (not administered)  sodium chloride 0.9 % bolus 1,000 mL (1,000 mLs Intravenous New Bag/Given 08/15/16 1440)  ondansetron (ZOFRAN) injection 4 mg (4 mg Intravenous Given 08/15/16 1448)  famotidine (PEPCID) IVPB 20 mg premix (0 mg Intravenous Stopped 08/15/16 1531)  morphine 2 MG/ML injection 2 mg (  2 mg Intravenous Given 08/15/16 1449)    [name] is 40 y.o. female presenting with Nausea vomiting diarrhea abdominal pain, onset several days ago. Abdominal exam is nonfocal, patient with normal vital signs. She also has an exacerbation of her chronic migraine likely secondary to mild dehydration. Blood work reassuring.  Her urinalysis does show nitrites with many bacteria, patient is asymptomatic, will not treat an asymptomatic bacteriuria. Patient is tolerated by mouth in the ED. Repeat abdominal exam benign. 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 Pt is hemodynamically stable and mentating appropriately. Discussed findings and plan with patient/guardian, who agrees with care plan. All questions answered. Return precautions discussed and outpatient follow up given.      Final Clinical Impressions(s) / ED Diagnoses   Final diagnoses:  Nausea vomiting and diarrhea    New Prescriptions New Prescriptions   DICYCLOMINE (BENTYL) 10 MG CAPSULE    Take 2 capsules (20 mg total) by mouth 4 (four) times daily as needed for spasms.   PROMETHAZINE (PHENERGAN) 25 MG TABLET    Take 1 tablet (25 mg  total) by mouth every 6 (six) hours as needed for nausea or vomiting.     Wynetta Emeryicole Lotus Gover, PA-C 08/15/16 1603    Nelva Nayobert Beaton, MD 08/17/16 [phone]

## 2016-08-15 NOTE — ED Notes (Signed)
Pt given sprite for PO challenge

## 2016-08-15 NOTE — ED Triage Notes (Signed)
Pt c/o n/v/d x 5 days.

## 2016-08-15 NOTE — Discharge Instructions (Signed)
Push fluids: take small frequent sips of water or Gatorade, do not drink any soda, juice or caffeinated beverages.   ° °Slowly resume solid diet as desired. Avoid food that are spicy, contain dairy and/or have high fat content. ° °Aviod NSAIDs (aspirin, motrin, ibuprofen, naproxen, Aleve et cetera) for pain control because they will irritate your stomach. ° ° °

## 2016-09-04 IMAGING — CT CT HEAD W/O CM
1 series · 12 of 14 positions shown, 15 images · IV contrast (APPLIED)
Comparison: CTA of the neck September 27, 2015

CLINICAL DATA: LEFT neck pain, dizziness.  Visual changes.

EXAM:
CT HEAD WITHOUT CONTRAST
TECHNIQUE: Contiguous axial images were obtained from the base of the skull
through the vertex without intravenous contrast.

[Series 2: head 4.8 h37s · axial · 0.46mm/px · z∈[-55,+94]mm · 12 of 36 slices shown, 15 images]
[im 3/36  soft-tissue]
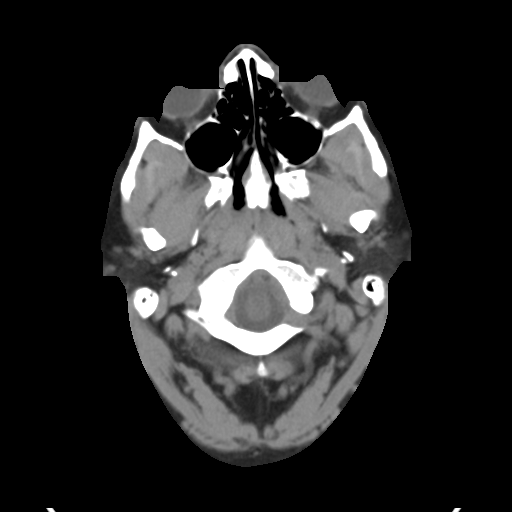
[im 3/36  bone]
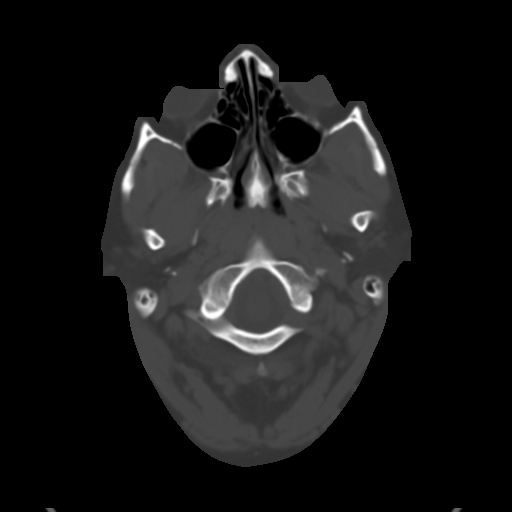
[im 6/36  bone]
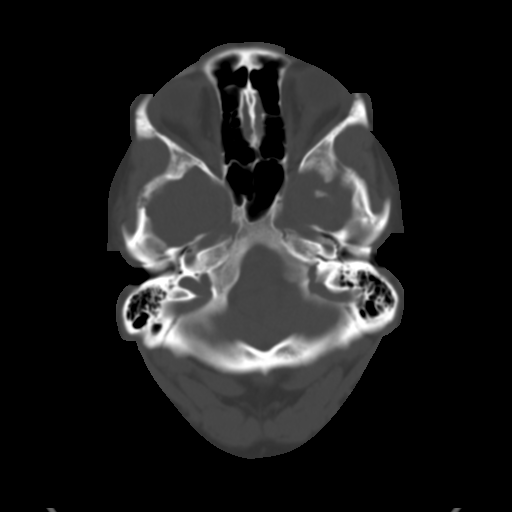
[im 9/36  bone]
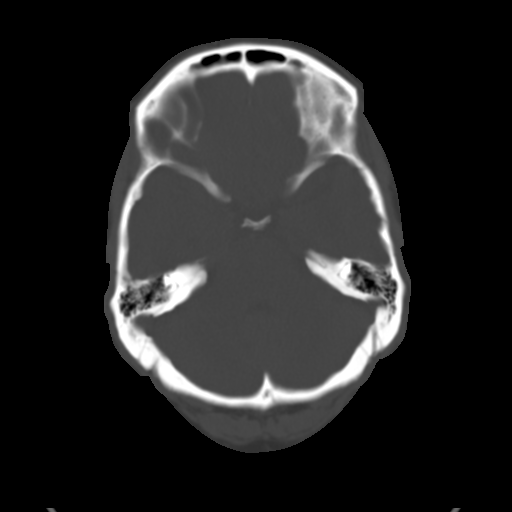
[im 11/36  bone]
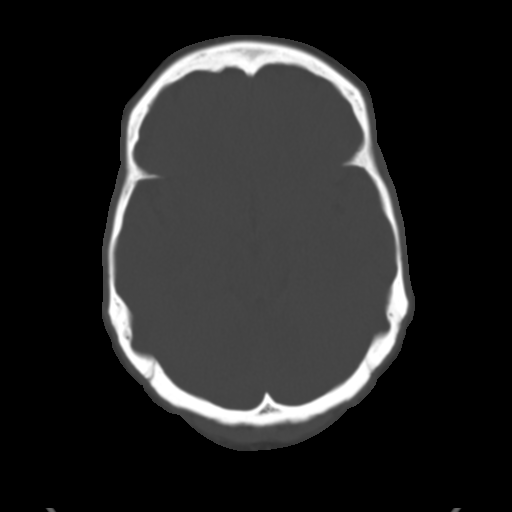
[im 14/36  soft-tissue]
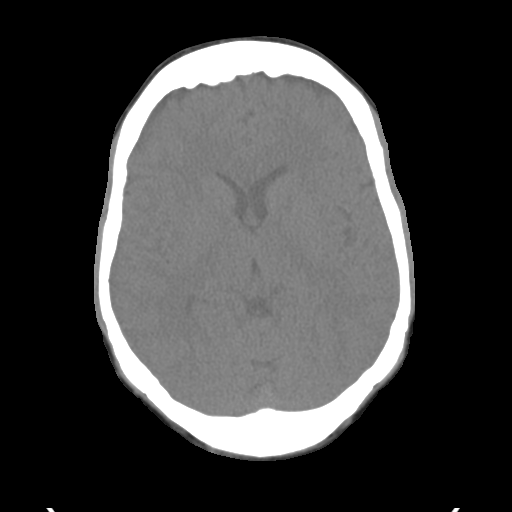
[im 14/36  bone]
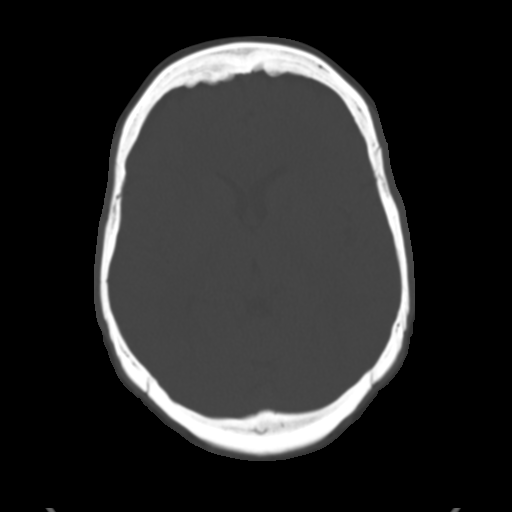
[im 17/36  bone]
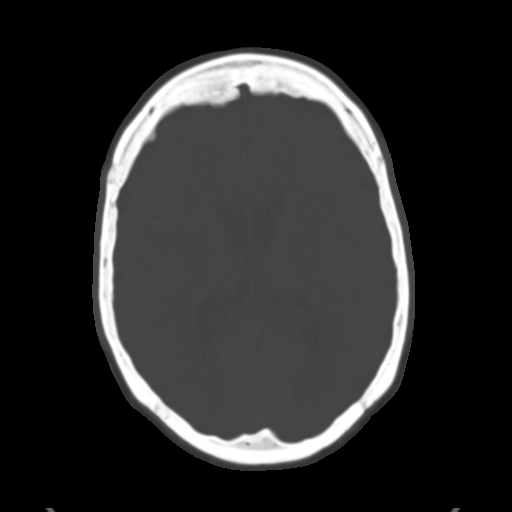
[im 19/36  bone]
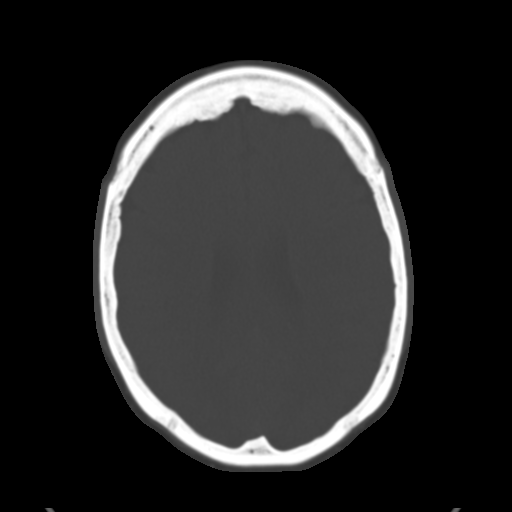
[im 22/36  bone]
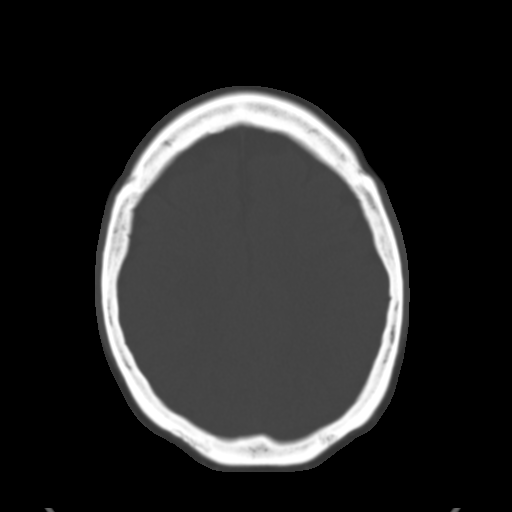
[im 25/36  soft-tissue]
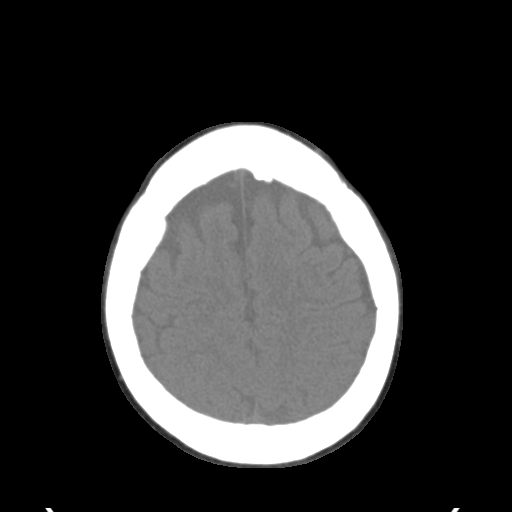
[im 25/36  bone]
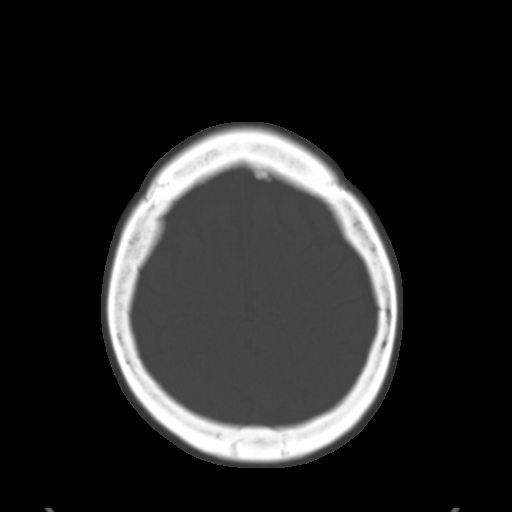
[im 27/36  bone]
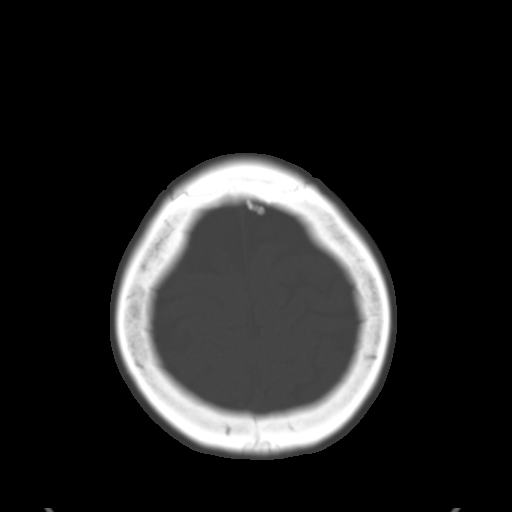
[im 30/36  bone]
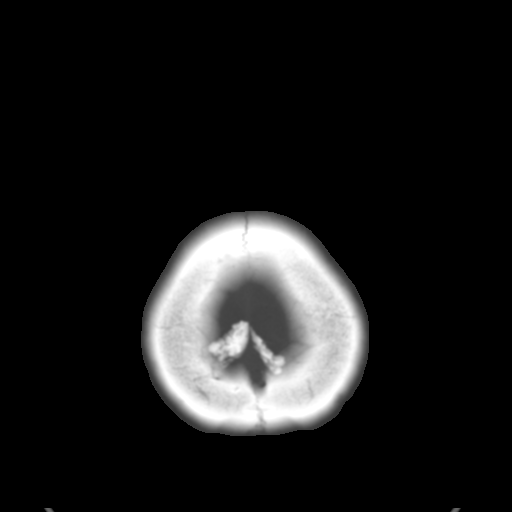
[im 33/36  bone]
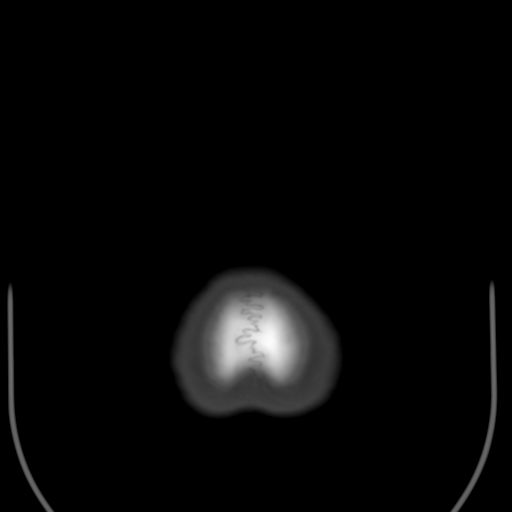

[12 of 14 positions shown; findings below may reference images not displayed]

FINDINGS: The ventricles and sulci are normal. No intraparenchymal hemorrhage,
mass effect nor midline shift. No acute large vascular territory
infarcts.

No abnormal extra-axial fluid collections. Basal cisterns are
patent.

No skull fracture. The included ocular globes and orbital contents
are non-suspicious. The mastoid aircells and included paranasal
sinuses are well-aerated.
IMPRESSION: Normal noncontrast CT head.

## 2016-11-01 ENCOUNTER — Encounter (HOSPITAL_BASED_OUTPATIENT_CLINIC_OR_DEPARTMENT_OTHER): Payer: Self-pay | Admitting: Emergency Medicine

## 2016-11-01 ENCOUNTER — Emergency Department (HOSPITAL_BASED_OUTPATIENT_CLINIC_OR_DEPARTMENT_OTHER)
Admission: EM | Admit: 2016-11-01 | Discharge: 2016-11-02 | Disposition: A | Discharge status: Home / Self Care | Payer: Self-pay | Attending: Emergency Medicine | Admitting: Emergency Medicine

## 2016-11-01 DIAGNOSIS — M545 Low back pain, unspecified: Secondary | ICD-10-CM

## 2016-11-01 DIAGNOSIS — Z87891 Personal history of nicotine dependence: Secondary | ICD-10-CM | POA: Insufficient documentation

## 2016-11-01 DIAGNOSIS — J45909 Unspecified asthma, uncomplicated: Secondary | ICD-10-CM | POA: Insufficient documentation

## 2016-11-01 DIAGNOSIS — R112 Nausea with vomiting, unspecified: Secondary | ICD-10-CM | POA: Insufficient documentation

## 2016-11-01 LAB — URINALYSIS, ROUTINE W REFLEX MICROSCOPIC
Bilirubin Urine: NEGATIVE
GLUCOSE, UA: NEGATIVE mg/dL
Ketones, ur: 15 mg/dL — AB
Nitrite: NEGATIVE
PROTEIN: NEGATIVE mg/dL
Specific Gravity, Urine: 1.025 (ref 1.005–1.030)
pH: 6 (ref 5.0–8.0)

## 2016-11-01 LAB — PREGNANCY, URINE: PREG TEST UR: NEGATIVE

## 2016-11-01 LAB — URINE MICROSCOPIC-ADD ON

## 2016-11-01 MED ORDER — SODIUM CHLORIDE 0.9 % IV BOLUS (SEPSIS)
1000.0000 mL | Freq: Once | INTRAVENOUS | Status: AC
  Administered 2016-11-02: 1000 mL via INTRAVENOUS

## 2016-11-01 MED ORDER — ONDANSETRON HCL 4 MG/2ML IJ SOLN
4.0000 mg | Freq: Once | INTRAMUSCULAR | Status: AC
  Administered 2016-11-02: 4 mg via INTRAVENOUS

## 2016-11-01 MED ORDER — KETOROLAC TROMETHAMINE 15 MG/ML IJ SOLN
15.0000 mg | Freq: Once | INTRAMUSCULAR | Status: AC
  Administered 2016-11-02: 15 mg via INTRAVENOUS

## 2016-11-01 NOTE — ED Triage Notes (Signed)
Patient reports she woke up with back pain. The patient also reports that she has had dizziness and weakness x 2 hours. Reports that "i feel like I am severely dehydrated because I stay dehydrated."

## 2016-11-01 NOTE — ED Provider Notes (Signed)
[facility] Provider Note: Lowella DellJ. Lane Lyncoln Maskell, MD, FACEP  CSN: [id number] MRN: [id number] ARRIVAL: 11/01/16 at 2024 ROOM: [facility]  By signing my name below, I, [name], attest that this documentation has been prepared under the direction and in the presence of Paula LibraJohn Ray Glacken, MD. Electronically Signed: [name], ED Scribe. 11/01/16. 11:47 PM.  CHIEF COMPLAINT  Back Pain   HISTORY OF PRESENT ILLNESS  [name] is a 40 y.o. female who presents to the Emergency Department complaining of bilateral lower back pain onset 2 days ago. Pt notes that she came into the ED tonight due to feeling dehydrated. She states that she is having associated symptoms of bilateral abdominal pain, vomiting x 1 episode, intermittent nausea, subjective fever, generalized weakness, and decreased urine output. She states that she has tried Pedialyte without medications for the relief for her symptoms. Pt reports that she consumed the Pedialyte due to feeling dehydrated and she notes that she doesn't consume enough liquids. Pt denies any other symptoms.    Past Medical History:  Diagnosis Date  . Alcoholic (HCC)   . Anxiety   . Asthma   . Bipolar 1 disorder (HCC)   . Depression   . Drug addiction in remission (HCC)   . H/O suicide attempt 2005   overdose  . PTSD (post-traumatic stress disorder)   . Seizures (HCC)    started in Dec 2015 - states last seizure 12/08/14 - has not seen neurologist     Past Surgical History:  Procedure Laterality Date  . CHOLECYSTECTOMY    . TONSILLECTOMY    . tubal ligation      Family History  Problem Relation Age of Onset  . Bipolar disorder Father     Social History  Substance Use Topics  . Smoking status: Former Games developermoker  . Smokeless tobacco: Never Used  . Alcohol use No    Prior to Admission medications   Medication Sig Start Date End Date Taking? Authorizing Provider  dicyclomine (BENTYL) 10 MG capsule Take 2 capsules (20 mg total) by  mouth 4 (four) times daily as needed for spasms. 08/15/16   Nicole Pisciotta, PA-C  promethazine (PHENERGAN) 25 MG tablet Take 1 tablet (25 mg total) by mouth every 6 (six) hours as needed for nausea or vomiting. 08/15/16   Nicole Pisciotta, PA-C    Allergies Morphine and related and Seroquel [quetiapine fumarate]   REVIEW OF SYSTEMS  Negative except as noted here or in the History of Present Illness.   PHYSICAL EXAMINATION  Initial Vital Signs Blood pressure (!) 133/112, pulse 97, temperature 98.7 F (37.1 C), temperature source Oral, resp. rate 17, height 5\' 4"  (1.626 m), weight 190 lb (86.2 kg), last menstrual period 11/01/2016, SpO2 100 %.  Examination General: Well-developed, well-nourished female in no acute distress; appearance consistent with age of record HENT: normocephalic; atraumatic Eyes: pupils equal, round and reactive to light; extraocular muscles intact Neck: supple Heart: regular rate and rhythm Lungs: clear to auscultation bilaterally Abdomen: soft; nondistended; left upper and right upper abdominal wall tenderness; no masses or hepatosplenomegaly; bowel sounds present Back: right para-lumbar tenderness; negative straight leg raise bilaterally. Extremities: No deformity; full range of motion; pulses normal Neurologic: Awake, alert and oriented; motor function intact in all extremities and symmetric; no facial droop Skin: Warm and dry Psychiatric: Normal mood and affect   RESULTS  Summary of this visit's results, reviewed by myself:   EKG Interpretation  Date/Time:  Friday November 01 2016 20:31:50 EST Ventricular Rate:  111 PR Interval:  144 QRS Duration: 78 QT Interval:  320 QTC Calculation: 435 R Axis:   52 Text Interpretation:  Sinus tachycardia Possible Left atrial enlargement Nonspecific ST abnormality Abnormal ECG SINCE LAST TRACING HEART RATE HAS INCREASED Confirmed by Ethelda ChickJACUBOWITZ  MD, Doreatha MartinSAM [phone]([id number]) on 11/01/2016 8:38:32 PM      Laboratory  Studies: Results for orders placed or performed during the hospital encounter of 11/01/16 (from the past 24 hour(s))  Urinalysis, Routine w reflex microscopic (not at [facility])     Status: Abnormal   Collection Time: 11/01/16  9:47 PM  Result Value Ref Range   Color, Urine YELLOW YELLOW   APPearance CLEAR CLEAR   Specific Gravity, Urine 1.025 1.005 - 1.030   pH 6.0 5.0 - 8.0   Glucose, UA NEGATIVE NEGATIVE mg/dL   Hgb urine dipstick TRACE (A) NEGATIVE   Bilirubin Urine NEGATIVE NEGATIVE   Ketones, ur 15 (A) NEGATIVE mg/dL   Protein, ur NEGATIVE NEGATIVE mg/dL   Nitrite NEGATIVE NEGATIVE   Leukocytes, UA MODERATE (A) NEGATIVE  Pregnancy, urine     Status: None   Collection Time: 11/01/16  9:47 PM  Result Value Ref Range   Preg Test, Ur NEGATIVE NEGATIVE  Urine microscopic-add on     Status: Abnormal   Collection Time: 11/01/16  9:47 PM  Result Value Ref Range   Squamous Epithelial / LPF 0-5 (A) NONE SEEN   WBC, UA 0-5 0 - 5 WBC/hpf   RBC / HPF 0-5 0 - 5 RBC/hpf   Bacteria, UA FEW (A) NONE SEEN   Imaging Studies: No results found.  ED COURSE  Nursing notes and initial vitals signs, including pulse oximetry, reviewed.  Vitals:   11/01/16 2031 11/01/16 2034 11/01/16 2319  BP: 133/89  (!) 133/112  Pulse: 110  97  Resp: 16  17  Temp: 98.1 F (36.7 C)  98.7 F (37.1 C)  TempSrc: Oral  Oral  SpO2: 100%  100%  Weight:  190 lb (86.2 kg)   Height:  5\' 4"  (1.626 m)    1:49 AM Patient feeling better after IV fluids and medications. She has a history of polysubstance abuse we will avoid any narcotic medications.  PROCEDURES    ED DIAGNOSES     ICD-9-CM ICD-10-CM   1. Acute right-sided low back pain without sciatica 724.2 M54.5   2. Nausea 787.02 R11.0     I personally performed the services described in this documentation, which was scribed in my presence. The recorded information has been reviewed and is accurate.     Paula LibraJohn Erza Mothershead, MD 11/02/16 [phone]

## 2016-11-02 MED ORDER — KETOROLAC TROMETHAMINE 15 MG/ML IJ SOLN
INTRAMUSCULAR | Status: AC
  Filled 2016-11-02: qty 1

## 2016-11-02 MED ORDER — NAPROXEN 500 MG PO TABS: 500.0000 mg | ORAL_TABLET | Freq: Two times a day (BID) | ORAL | 0 refills | Status: AC | PRN

## 2016-11-02 MED ORDER — ONDANSETRON HCL 4 MG/2ML IJ SOLN
INTRAMUSCULAR | Status: AC
  Filled 2016-11-02: qty 2

## 2016-11-02 MED ORDER — ONDANSETRON 8 MG PO TBDP: 8.0000 mg | ORAL_TABLET | Freq: Three times a day (TID) | ORAL | 0 refills | Status: DC | PRN

## 2018-11-12 ENCOUNTER — Emergency Department (HOSPITAL_COMMUNITY): Payer: Self-pay

## 2018-11-12 ENCOUNTER — Other Ambulatory Visit: Payer: Self-pay

## 2018-11-12 ENCOUNTER — Emergency Department (HOSPITAL_COMMUNITY)
Admission: EM | Admit: 2018-11-12 | Discharge: 2018-11-12 | Disposition: A | Discharge status: Home / Self Care | Payer: Self-pay | Attending: Emergency Medicine | Admitting: Emergency Medicine

## 2018-11-12 DIAGNOSIS — R569 Unspecified convulsions: Secondary | ICD-10-CM | POA: Insufficient documentation

## 2018-11-12 DIAGNOSIS — I10 Essential (primary) hypertension: Secondary | ICD-10-CM | POA: Insufficient documentation

## 2018-11-12 DIAGNOSIS — J45909 Unspecified asthma, uncomplicated: Secondary | ICD-10-CM | POA: Insufficient documentation

## 2018-11-12 DIAGNOSIS — Z87891 Personal history of nicotine dependence: Secondary | ICD-10-CM | POA: Insufficient documentation

## 2018-11-12 LAB — URINALYSIS, ROUTINE W REFLEX MICROSCOPIC
Bilirubin Urine: NEGATIVE
Glucose, UA: NEGATIVE mg/dL
Hgb urine dipstick: NEGATIVE
Ketones, ur: NEGATIVE mg/dL
Nitrite: NEGATIVE
Protein, ur: NEGATIVE mg/dL
Specific Gravity, Urine: 1.014 (ref 1.005–1.030)
pH: 6 (ref 5.0–8.0)

## 2018-11-12 LAB — BASIC METABOLIC PANEL
Anion gap: 12 (ref 5–15)
BUN: 10 mg/dL (ref 6–20)
CO2: 24 mmol/L (ref 22–32)
Calcium: 8.8 mg/dL — ABNORMAL LOW (ref 8.9–10.3)
Chloride: 103 mmol/L (ref 98–111)
Creatinine, Ser: 0.97 mg/dL (ref 0.44–1.00)
GFR calc Af Amer: >60 mL/min (ref >60)
GFR calc non Af Amer: >60 mL/min (ref >60)
Glucose, Bld: 130 mg/dL — ABNORMAL HIGH (ref 70–99)
Potassium: 4.2 mmol/L (ref 3.5–5.1)
Sodium: 139 mmol/L (ref 135–145)

## 2018-11-12 LAB — CBC WITH DIFFERENTIAL/PLATELET
Abs Immature Granulocytes: 0.05 10*3/uL (ref 0.00–0.07)
Basophils Absolute: 0.1 10*3/uL (ref 0.0–0.1)
Basophils Relative: 1 %
Eosinophils Absolute: 0.4 10*3/uL (ref 0.0–0.5)
Eosinophils Relative: 4 %
HCT: 43.2 % (ref 36.0–46.0)
Hemoglobin: 13.5 g/dL (ref 12.0–15.0)
Immature Granulocytes: 1 %
Lymphocytes Relative: 28 %
Lymphs Abs: 3 10*3/uL (ref 0.7–4.0)
MCH: 28.2 pg (ref 26.0–34.0)
MCHC: 31.3 g/dL (ref 30.0–36.0)
MCV: 90.4 fL (ref 80.0–100.0)
Monocytes Absolute: 0.9 10*3/uL (ref 0.1–1.0)
Monocytes Relative: 8 %
Neutro Abs: 6.5 10*3/uL (ref 1.7–7.7)
Neutrophils Relative %: 58 %
Platelets: 331 10*3/uL (ref 150–400)
RBC: 4.78 MIL/uL (ref 3.87–5.11)
RDW: 12.6 % (ref 11.5–15.5)
WBC: 10.9 10*3/uL — ABNORMAL HIGH (ref 4.0–10.5)
nRBC: 0 % (ref 0.0–0.2)

## 2018-11-12 NOTE — ED Triage Notes (Signed)
Pt arrives via POV from home with high blood pressure, headache, questionable seizures at home. Reports fiance saw gran mal seizure 2 weeks ago with urinary incontinence. C/o severe headache, dizziness, pt awake, alert, appropriate at present. Reports she thinks she had a seizure this morning. Felt her body twitching after.

## 2018-11-12 NOTE — ED Provider Notes (Signed)
[facility] EMERGENCY DEPARTMENT Provider Note   CSN: [id number] Arrival date & time: 11/12/18  0749     History   Chief Complaint Chief Complaint  Patient presents with  . Hypertension    HPI Loretta Hood is a 42 y.o. female.  HPI   42 year old female with possible seizure-like activity.  She reports 3 separate events for the past 2 weeks.  Told by her fianc that she has episodes of shaking.  She is amnestic to these events.  She states that she will wake up she will have some mild jerking in her extremities have a headache.  She reports that she was incontinent with 1 of these episodes.  She was laying in bed this morning most recent episode occurred.  Denies any past history of seizures.  Elicit history of alcohol abuse.  She states that she has been sober for years.  No acute neurologic complaints otherwise.  No fevers or chills.  Past Medical History:  Diagnosis Date  . Alcoholic (HCC)   . Anxiety   . Asthma   . Bipolar 1 disorder (HCC)   . Depression   . Drug addiction in remission (HCC)   . H/O suicide attempt 2005   overdose  . PTSD (post-traumatic stress disorder)   . Seizures (HCC)    started in Dec 2015 - states last seizure 12/08/14 - has not seen neurologist     Patient Active Problem List   Diagnosis Date Noted  . Bipolar disorder, current episode depressed, severe, with psychotic features (HCC) 06/02/2015  . PTSD (post-traumatic stress disorder) 06/02/2015  . Cocaine use disorder, severe, in sustained remission (HCC) 06/02/2015  . Cannabis use disorder, severe, in sustained remission (HCC) 06/02/2015  . Opioid use disorder, moderate, in sustained remission (HCC) 06/02/2015  . Alcohol use disorder, severe, in sustained remission (HCC) 06/02/2015  . DYSPLASIA OF CERVIX UNSPECIFIED 10/02/2009  . FATIGUE 10/02/2009  . MEMORY LOSS 10/02/2009  . NUMBNESS 10/02/2009  . DRUG ABUSE, HX OF 10/02/2009    Past Surgical History:    Procedure Laterality Date  . CHOLECYSTECTOMY    . TONSILLECTOMY    . tubal ligation       OB History   No obstetric history on file.      Home Medications    Prior to Admission medications   Medication Sig Start Date End Date Taking? Authorizing Provider  dicyclomine (BENTYL) 10 MG capsule Take 2 capsules (20 mg total) by mouth 4 (four) times daily as needed for spasms. 08/15/16   Pisciotta, Joni Reining, PA-C  naproxen (NAPROSYN) 500 MG tablet Take 1 tablet (500 mg total) by mouth 2 (two) times daily as needed (for back pain). 11/02/16   Molpus, John, MD  ondansetron (ZOFRAN ODT) 8 MG disintegrating tablet Take 1 tablet (8 mg total) by mouth every 8 (eight) hours as needed for nausea or vomiting. 11/02/16   Molpus, John, MD  promethazine (PHENERGAN) 25 MG tablet Take 1 tablet (25 mg total) by mouth every 6 (six) hours as needed for nausea or vomiting. 08/15/16   Pisciotta, Joni Reining, PA-C    Family History Family History  Problem Relation Age of Onset  . Bipolar disorder Father     Social History Social History   Tobacco Use  . Smoking status: Former Games developer  . Smokeless tobacco: Never Used  Substance Use Topics  . Alcohol use: No  . Drug use: No    Comment: recovering alcoholic and opiate/cocaine abuser  Allergies   Morphine and related and Seroquel [quetiapine fumarate]   Review of Systems Review of Systems  All systems reviewed and negative, other than as noted in HPI.  Physical Exam Updated Vital Signs BP (!) 177/144 (BP Location: Right Arm)   Pulse (!) 117   Temp 98 F (36.7 C) (Oral)   Resp 16   Ht 5\' 4"  (1.626 m)   Wt 90.7 kg   LMP 10/29/2018   SpO2 100%   BMI 34.33 kg/m   Physical Exam Vitals signs and nursing note reviewed.  Constitutional:      General: She is not in acute distress.    Appearance: She is well-developed.  HENT:     Head: Normocephalic and atraumatic.  Eyes:     General:        Right eye: No discharge.        Left eye: No  discharge.     Conjunctiva/sclera: Conjunctivae normal.  Neck:     Musculoskeletal: Neck supple.  Cardiovascular:     Rate and Rhythm: Normal rate and regular rhythm.     Heart sounds: Normal heart sounds. No murmur. No friction rub. No gallop.   Pulmonary:     Effort: Pulmonary effort is normal. No respiratory distress.     Breath sounds: Normal breath sounds.  Abdominal:     General: There is no distension.     Palpations: Abdomen is soft.     Tenderness: There is no abdominal tenderness.  Musculoskeletal:        General: No tenderness.  Skin:    General: Skin is warm and dry.     Findings: Rash present.     Comments: Splotchy nonraised erythematous rash noted to face neck and upper torso.  Blanches.  Neurological:     General: No focal deficit present.     Mental Status: She is alert and oriented to person, place, and time.     Cranial Nerves: No cranial nerve deficit.     Sensory: No sensory deficit.     Motor: No weakness.     Coordination: Coordination normal.     Gait: Gait normal.  Psychiatric:        Behavior: Behavior normal.        Thought Content: Thought content normal.      ED Treatments / Results  Labs (all labs ordered are listed, but only abnormal results are displayed) Labs Reviewed  CBC WITH DIFFERENTIAL/PLATELET - Abnormal; Notable for the following components:      Result Value   WBC 10.9 (*)    All other components within normal limits  BASIC METABOLIC PANEL  URINALYSIS, ROUTINE W REFLEX MICROSCOPIC    EKG EKG Interpretation  Date/Time:  Thursday November 12 2018 08:35:29 EST Ventricular Rate:  82 PR Interval:    QRS Duration: 94 QT Interval:  370 QTC Calculation: 433 R Axis:   67 Text Interpretation:  Sinus rhythm Confirmed by Raeford Razor [phone]) on 11/12/2018 8:52:20 AM   Radiology Ct Head Wo Contrast  Result Date: 11/12/2018 CLINICAL DATA:  Left temporal headache, hypertension. EXAM: CT HEAD WITHOUT CONTRAST TECHNIQUE:  Contiguous axial images were obtained from the base of the skull through the vertex without intravenous contrast. COMPARISON:  MRI 10/06/2015. FINDINGS: Brain: No acute intracranial abnormality. Specifically, no hemorrhage, hydrocephalus, mass lesion, acute infarction, or significant intracranial injury. Vascular: No hyperdense vessel or unexpected calcification. Skull: No acute calvarial abnormality. Sinuses/Orbits: Visualized paranasal sinuses and mastoids clear. Orbital soft tissues unremarkable. Other:  None IMPRESSION: Normal study. Electronically Signed   By: Charlett NoseKevin  Dover M.D.   On: 11/12/2018 09:28    Procedures Procedures (including critical care time)  Medications Ordered in ED Medications - No data to display   Initial Impression / Assessment and Plan / ED Course  I have reviewed the triage vital signs and the nursing notes.  Pertinent labs & imaging results that were available during my care of the patient were reviewed by me and considered in my medical decision making (see chart for details).     42 year old female with possible seizure-like activity.  Noted to be hypertensive and tachycardic at rest.  She is afebrile.  Nonfocal neurologic examination.  The suggestive alcohol abuse and states that she has been sober for several years.  Otherwise she does not strike me as somebody has overtly withdrawing from alcohol.  Plan CT head, basic labs and EKG.  Labs and CT head unremarkable.  We will send a urine culture given and pressure-like sensation intermittently when she pees.  Advised to establish PCP to address hypertension.  Also given neurology contact information for follow-up.  I would not start her on a antihypertensive for antiepileptic at this point though.  Final Clinical Impressions(s) / ED Diagnoses   Final diagnoses:  Seizure-like activity (HCC)  Hypertension, unspecified type    ED Discharge Orders    None       Raeford RazorKohut, Astella Desir, MD 11/12/18 1025

## 2018-11-12 NOTE — ED Notes (Signed)
Pt returned to hallway 24 from CT scan

## 2018-11-12 NOTE — Discharge Instructions (Signed)
Your work-up today was pretty unremarkable.  The CT scan of your head did not show any acute abnormalities.  You were noted to be hypertensive.  At this point I would like you to monitor it.  You need to establish with a PCP.  If your blood pressure remains persistently elevated, they can start you on appropriate medication.  I am also recommending that you follow-up with a neurologist.  The contact information for go for neurologic associates this is included with your discharge paperwork.

## 2018-11-14 LAB — URINE CULTURE: Culture: 80000 — AB

## 2018-11-15 ENCOUNTER — Telehealth: Payer: Self-pay

## 2018-11-15 NOTE — Telephone Encounter (Signed)
No treatment for [facility] ED 11/12/18 PEr [facility]

## 2018-11-15 NOTE — Progress Notes (Signed)
ED Antimicrobial Stewardship Positive Culture Follow Up   Loretta Hood is an 42 y.o. female who [facility] with a chief complaint of  Chief Complaint  Patient presents with  . Hypertension    Recent Results (from the past 720 hour(s))  Urine culture     Status: Abnormal   Collection Time: 11/12/18 10:29 AM  Result Value Ref Range Status   Specimen Description URINE, RANDOM  Final   Special Requests   Final    NONE Performed at [facility], [address] [place], [place] [address]    Culture (A)  Final    80,000 COLONIES/mL ESCHERICHIA COLI Confirmed Extended Spectrum Beta-Lactamase Producer (ESBL).  In bloodstream infections from ESBL organisms, carbapenems are preferred over piperacillin/tazobactam. They are shown to have a lower risk of mortality.    Report Status 11/14/2018 FINAL  Final   Organism ID, Bacteria ESCHERICHIA COLI (A)  Final      Susceptibility   Escherichia coli - MIC*    AMPICILLIN >=32 RESISTANT Resistant     CEFAZOLIN >=64 RESISTANT Resistant     CEFTRIAXONE >=64 RESISTANT Resistant     CIPROFLOXACIN 2 INTERMEDIATE Intermediate     GENTAMICIN <=1 SENSITIVE Sensitive     IMIPENEM <=0.25 SENSITIVE Sensitive     NITROFURANTOIN <=16 SENSITIVE Sensitive     TRIMETH/SULFA <=20 SENSITIVE Sensitive     AMPICILLIN/SULBACTAM >=32 RESISTANT Resistant     PIP/TAZO <=4 SENSITIVE Sensitive     Extended ESBL POSITIVE Resistant     * 80,000 COLONIES/mL ESCHERICHIA COLI   Given no reported s/sx of UTI, culture result representative of asymptomatic bacteriuria. No treatment required.  ED Provider: Harolyn RutherfordShawn Joy, PA-C  Roderic ScarceErin N. Zigmund Danieleja, PharmD, BCPS PGY2 Infectious Diseases Pharmacy Resident Phone: [phone] 11/15/2018, 9:10 AM

## 2020-11-18 ENCOUNTER — Emergency Department (HOSPITAL_COMMUNITY)
Admission: EM | Admit: 2020-11-18 | Discharge: 2020-11-18 | Disposition: A | Discharge status: Home / Self Care | Payer: BC Managed Care – PPO | Attending: Emergency Medicine | Admitting: Emergency Medicine

## 2020-11-18 ENCOUNTER — Encounter (HOSPITAL_COMMUNITY): Payer: Self-pay

## 2020-11-18 ENCOUNTER — Emergency Department (HOSPITAL_COMMUNITY): Payer: BC Managed Care – PPO

## 2020-11-18 ENCOUNTER — Other Ambulatory Visit: Payer: Self-pay

## 2020-11-18 DIAGNOSIS — R000 Tachycardia, unspecified: Secondary | ICD-10-CM | POA: Diagnosis not present

## 2020-11-18 DIAGNOSIS — Z20822 Contact with and (suspected) exposure to covid-19: Secondary | ICD-10-CM | POA: Insufficient documentation

## 2020-11-18 DIAGNOSIS — Z87891 Personal history of nicotine dependence: Secondary | ICD-10-CM | POA: Insufficient documentation

## 2020-11-18 DIAGNOSIS — M791 Myalgia, unspecified site: Secondary | ICD-10-CM | POA: Insufficient documentation

## 2020-11-18 DIAGNOSIS — J189 Pneumonia, unspecified organism: Secondary | ICD-10-CM

## 2020-11-18 DIAGNOSIS — J45909 Unspecified asthma, uncomplicated: Secondary | ICD-10-CM | POA: Insufficient documentation

## 2020-11-18 DIAGNOSIS — R059 Cough, unspecified: Secondary | ICD-10-CM | POA: Diagnosis present

## 2020-11-18 DIAGNOSIS — J181 Lobar pneumonia, unspecified organism: Secondary | ICD-10-CM | POA: Diagnosis not present

## 2020-11-18 LAB — CBC WITH DIFFERENTIAL/PLATELET
Abs Immature Granulocytes: 0.02 10*3/uL (ref 0.00–0.07)
Basophils Absolute: 0 10*3/uL (ref 0.0–0.1)
Basophils Relative: 0 %
Eosinophils Absolute: 0.2 10*3/uL (ref 0.0–0.5)
Eosinophils Relative: 2 %
HCT: 41.3 % (ref 36.0–46.0)
Hemoglobin: 13.6 g/dL (ref 12.0–15.0)
Immature Granulocytes: 0 %
Lymphocytes Relative: 10 %
Lymphs Abs: 0.8 10*3/uL (ref 0.7–4.0)
MCH: 29.6 pg (ref 26.0–34.0)
MCHC: 32.9 g/dL (ref 30.0–36.0)
MCV: 89.8 fL (ref 80.0–100.0)
Monocytes Absolute: 1.1 10*3/uL — ABNORMAL HIGH (ref 0.1–1.0)
Monocytes Relative: 14 %
Neutro Abs: 5.7 10*3/uL (ref 1.7–7.7)
Neutrophils Relative %: 74 %
Platelets: 205 10*3/uL (ref 150–400)
RBC: 4.6 MIL/uL (ref 3.87–5.11)
RDW: 12.8 % (ref 11.5–15.5)
WBC: 7.8 10*3/uL (ref 4.0–10.5)
nRBC: 0 % (ref 0.0–0.2)

## 2020-11-18 LAB — BASIC METABOLIC PANEL
Anion gap: 11 (ref 5–15)
BUN: 8 mg/dL (ref 6–20)
CO2: 23 mmol/L (ref 22–32)
Calcium: 8.5 mg/dL — ABNORMAL LOW (ref 8.9–10.3)
Chloride: 104 mmol/L (ref 98–111)
Creatinine, Ser: 0.83 mg/dL (ref 0.44–1.00)
GFR, Estimated: >60 mL/min (ref >60)
Glucose, Bld: 102 mg/dL — ABNORMAL HIGH (ref 70–99)
Potassium: 3.7 mmol/L (ref 3.5–5.1)
Sodium: 138 mmol/L (ref 135–145)

## 2020-11-18 LAB — RESP PANEL BY RT-PCR (FLU A&B, COVID) ARPGX2
Influenza A by PCR: NEGATIVE
Influenza B by PCR: NEGATIVE
SARS Coronavirus 2 by RT PCR: NEGATIVE

## 2020-11-18 MED ORDER — ALBUTEROL SULFATE HFA 108 (90 BASE) MCG/ACT IN AERS
4.0000 | INHALATION_SPRAY | Freq: Once | RESPIRATORY_TRACT | Status: AC
  Administered 2020-11-18: 4 via RESPIRATORY_TRACT
  Filled 2020-11-18: qty 6.7

## 2020-11-18 MED ORDER — DOXYCYCLINE HYCLATE 100 MG PO CAPS: 100.0000 mg | ORAL_CAPSULE | Freq: Two times a day (BID) | ORAL | 0 refills | Status: AC

## 2020-11-18 MED ORDER — SODIUM CHLORIDE 0.9 % IV BOLUS
1000.0000 mL | Freq: Once | INTRAVENOUS | Status: AC
  Administered 2020-11-18: 1000 mL via INTRAVENOUS

## 2020-11-18 MED ORDER — IBUPROFEN 200 MG PO TABS
600.0000 mg | ORAL_TABLET | Freq: Once | ORAL | Status: AC
  Administered 2020-11-18: 600 mg via ORAL
  Filled 2020-11-18: qty 3

## 2020-11-18 NOTE — ED Provider Notes (Signed)
[facility]-EMERGENCY DEPT Provider Note   CSN: [id number] Arrival date & time: 11/18/20  1228     History No chief complaint on file.   Francetta FoundBrandy S Upshaw is a 44 y.o. female who presents to the ED today via EMS with complaint of gradual onset, constant, productive cough x 3 days. Pt also complains of fevers with Tmax 102.0, body aches, and shortness of breath.  Patient reports that a family member recently was sick.  She states that they were tested for Covid and tested negative was told it was a viral illness.  Patient then began having similar symptoms a few days later.  She has received 1 dose of her maternal vaccine in October and never got around to getting the second vaccine.  Patient did take Tylenol prior to arrival to the ED today.  She states that last night while she was taking while applies to her children she began feeling short of breath.  She states when she woke up earlier today she felt more short of breath and then felt like her throat was closing up and she could not breathe so she called 911.  Since this seems to have gotten better, was not given anything by EMS.  Denies headache, vision changes, chest pain, hemoptysis, abdominal pain, nausea, diarrhea, any other associated symptoms.  The history is provided by the patient and medical records.       Past Medical History:  Diagnosis Date  . Alcoholic (HCC)   . Anxiety   . Asthma   . Bipolar 1 disorder (HCC)   . Depression   . Drug addiction in remission (HCC)   . H/O suicide attempt 2005   overdose  . PTSD (post-traumatic stress disorder)   . Seizures (HCC)    started in Dec 2015 - states last seizure 12/08/14 - has not seen neurologist     Patient Active Problem List   Diagnosis Date Noted  . Bipolar disorder, current episode depressed, severe, with psychotic features (HCC) 06/02/2015  . PTSD (post-traumatic stress disorder) 06/02/2015  . Cocaine use disorder, severe, in sustained  remission (HCC) 06/02/2015  . Cannabis use disorder, severe, in sustained remission (HCC) 06/02/2015  . Opioid use disorder, moderate, in sustained remission (HCC) 06/02/2015  . Alcohol use disorder, severe, in sustained remission (HCC) 06/02/2015  . DYSPLASIA OF CERVIX UNSPECIFIED 10/02/2009  . FATIGUE 10/02/2009  . MEMORY LOSS 10/02/2009  . NUMBNESS 10/02/2009  . DRUG ABUSE, HX OF 10/02/2009    Past Surgical History:  Procedure Laterality Date  . CHOLECYSTECTOMY    . TONSILLECTOMY    . tubal ligation       OB History   No obstetric history on file.     Family History  Problem Relation Age of Onset  . Bipolar disorder Father     Social History   Tobacco Use  . Smoking status: Former Games developermoker  . Smokeless tobacco: Never Used  Substance Use Topics  . Alcohol use: No  . Drug use: No    Comment: recovering alcoholic and opiate/cocaine abuser    Home Medications Prior to Admission medications   Medication Sig Start Date End Date Taking? Authorizing Provider  acetaminophen (TYLENOL) 500 MG tablet Take 500 mg by mouth every 6 (six) hours as needed for fever.   Yes [provider]  dicyclomine (BENTYL) 10 MG capsule Take 2 capsules (20 mg total) by mouth 4 (four) times daily as needed for spasms. Patient not taking: No sig reported 08/15/16  Pisciotta, Joni Reining, PA-C  doxycycline (VIBRAMYCIN) 100 MG capsule Take 1 capsule (100 mg total) by mouth 2 (two) times daily. 11/18/20   Hyman Hopes, Mathea Frieling, PA-C  naproxen (NAPROSYN) 500 MG tablet Take 1 tablet (500 mg total) by mouth 2 (two) times daily as needed (for back pain). Patient not taking: No sig reported 11/02/16   Molpus, Jonny Ruiz, MD  ondansetron (ZOFRAN ODT) 8 MG disintegrating tablet Take 1 tablet (8 mg total) by mouth every 8 (eight) hours as needed for nausea or vomiting. Patient not taking: No sig reported 11/02/16   Molpus, Jonny Ruiz, MD  promethazine (PHENERGAN) 25 MG tablet Take 1 tablet (25 mg total) by mouth every 6  (six) hours as needed for nausea or vomiting. Patient not taking: No sig reported 08/15/16   Pisciotta, Joni Reining, PA-C    Allergies    Morphine and related and Seroquel [quetiapine fumarate]  Review of Systems   Review of Systems  Constitutional: Positive for chills, fatigue and fever.  Respiratory: Positive for cough and shortness of breath.   Cardiovascular: Negative for chest pain.  Gastrointestinal: Negative for abdominal pain and diarrhea.  Musculoskeletal: Positive for myalgias.  All other systems reviewed and are negative.   Physical Exam Updated Vital Signs BP (!) 144/100   Pulse (!) 109   Temp 99 F (37.2 C) (Oral)   Resp 18   Ht 5\' 3"  (1.6 m)   Wt 83.9 kg   SpO2 96%   BMI 32.77 kg/m   Physical Exam Vitals and nursing note reviewed.  Constitutional:      Appearance: She is not ill-appearing or diaphoretic.  HENT:     Head: Normocephalic and atraumatic.     Mouth/Throat:     Mouth: Mucous membranes are moist.     Pharynx: No oropharyngeal exudate or posterior oropharyngeal erythema.     Comments: Uvula is midline. Airway is patent.  Eyes:     Conjunctiva/sclera: Conjunctivae normal.  Cardiovascular:     Rate and Rhythm: Regular rhythm. Tachycardia present.  Pulmonary:     Effort: Tachypnea present.     Breath sounds: Decreased air movement present. Wheezing present.     Comments: Increased work of breathing. Speaking in short sentences between coughing spells. Decreased air movement and end expiratory wheezes. Satting 96% on RA.  Abdominal:     Palpations: Abdomen is soft.     Tenderness: There is no abdominal tenderness.  Musculoskeletal:     Cervical back: Neck supple.     Right lower leg: No edema.     Left lower leg: No edema.  Skin:    General: Skin is warm and dry.  Neurological:     Mental Status: She is alert.     ED Results / Procedures / Treatments   Labs (all labs ordered are listed, but only abnormal results are displayed) Labs  Reviewed  BASIC METABOLIC PANEL - Abnormal; Notable for the following components:      Result Value   Glucose, Bld 102 (*)    Calcium 8.5 (*)    All other components within normal limits  CBC WITH DIFFERENTIAL/PLATELET - Abnormal; Notable for the following components:   Monocytes Absolute 1.1 (*)    All other components within normal limits  RESP PANEL BY RT-PCR (FLU A&B, COVID) [id number]    EKG None  Radiology DG Chest Port 1 View  Result Date: 11/18/2020 CLINICAL DATA:  Cough and fever EXAM: PORTABLE CHEST 1 VIEW COMPARISON:  January 29, 2011 FINDINGS: The cardiomediastinal silhouette  is normal in contour. No pleural effusion. No pneumothorax. Faint LEFT perihilar reticulonodular opacities. Visualized abdomen is unremarkable. No acute osseous abnormality noted. IMPRESSION: Faint LEFT perihilar reticulonodular opacities could reflect atypical/viral pneumonia versus aspiration or atelectasis. Electronically Signed   By: Meda Klinefelter MD   On: 11/18/2020 13:58    Procedures Procedures (including critical care time)  Medications Ordered in ED Medications  ibuprofen (ADVIL) tablet 600 mg (has no administration in time range)  albuterol (VENTOLIN HFA) 108 (90 Base) MCG/ACT inhaler 4 puff (4 puffs Inhalation Given 11/18/20 1331)  sodium chloride 0.9 % bolus 1,000 mL (1,000 mLs Intravenous New Bag/Given 11/18/20 1803)    ED Course  I have reviewed the triage vital signs and the nursing notes.  Pertinent labs & imaging results that were available during my care of the patient were reviewed by me and considered in my medical decision making (see chart for details).  Clinical Course as of 11/18/20 1904  Sat Nov 18, 2020  1549 SARS Coronavirus 2 by RT PCR: NEGATIVE [MV]    Clinical Course User Index [MV] Tanda Rockers, PA-C   MDM Rules/Calculators/A&P                          44 year old female presents to the ED today via EMS with complaint of worsening shortness of breath  for the past 3 days with associated productive cough, fevers, chills, body aches.  T-max 102 at home.  Reports that family member was recently sick, tested negative for Covid.  Patient has received 1 dose of her [place] vaccine in October.  On arrival to the ED temperature 99.0.  Took Tylenol approximately 5 hours ago for fever.  She is tachycardic at 109.  She does appear to be tachypneic today however respiratory rate even at 18.  She is satting 96% on room air.  On exam she has decreased air movement and inspiratory wheezes.  She is speaking in short sentences between coughing spells.  She states that she called 911 as she felt like her throat was closing up, there is no posterior oropharyngeal edema.  Uvula is midline and airway is patent.  Phonating normally.  Suspect that this was secondary to patient's shortness of breath.  We will plan for EKG, chest x-ray, Covid test at this time.  Will provide albuterol inhaler and reevaluate.   CXR  IMPRESSION:  Faint LEFT perihilar reticulonodular opacities could reflect  atypical/viral pneumonia versus aspiration or atelectasis.   I doubt aspiration at this time given her symptoms do not sound concerning. She has had recent sick contact with family member. Will treat for pneumonia if COVID negative.   Will await COVID results at this time. Will add on labs given findings concerning for pneumonia.   CBC without leukocytosis. Hgb stable at 13.6.  BMP without electrolyte abnormalities.  COVID negative and flu negative.   On reevaluation pt continues to be tachycardic. Will provide fluids at this time. Pt was ambulated and O2 remained above 94%. Do not feel pt needs admission.   HR has improved after fluids. Will plan to discharge home and cover with abx for infection. Will cover with doxy. Pt to follow up with her PCP. She is in agreement with plan and stable for discharge.   This note was prepared using Dragon voice recognition software and may include  unintentional dictation errors due to the inherent limitations of voice recognition software.  TAKYAH CIARAMITARO was evaluated in Emergency  Department on 11/18/2020 for the symptoms described in the history of present illness. She was evaluated in the context of the global COVID-19 pandemic, which necessitated consideration that the patient might be at risk for infection with the SARS-CoV-2 virus that causes COVID-19. Institutional protocols and algorithms that pertain to the evaluation of patients at risk for COVID-19 are in a state of rapid change based on information released by regulatory bodies including the CDC and federal and state organizations. These policies and algorithms were followed during the patient's care in the ED.   Final Clinical Impression(s) / ED Diagnoses Final diagnoses:  Community acquired pneumonia of left lung, unspecified part of lung    Rx / DC Orders ED Discharge Orders         Ordered    doxycycline (VIBRAMYCIN) 100 MG capsule  2 times daily        11/18/20 1903           Discharge Instructions     You have tested negative for COVID and flu. Your chest xray looked concerning for a possible pneumonia. We will treat you for infection at this time given your COVID test is negative.   Pick up antibiotics and take as prescribed.   Follow up with your PCP regarding your ED visit  Return to the ED for any worsening symptoms        Tanda Rockers, PA-C 11/18/20 1904    Lorre Nick, MD 11/19/20 1310

## 2020-11-18 NOTE — ED Triage Notes (Signed)
Patient presented to the ED with c/o shortness of breath and dry cough started about 3 days ago and getting worse.

## 2020-11-18 NOTE — Discharge Instructions (Addendum)
You have tested negative for COVID and flu. Your chest xray looked concerning for a possible pneumonia. We will treat you for infection at this time given your COVID test is negative.   Pick up antibiotics and take as prescribed.   Follow up with your PCP regarding your ED visit  Return to the ED for any worsening symptoms

## 2020-11-18 NOTE — ED Notes (Signed)
Pt was at 94% while ambulating

## 2021-10-22 IMAGING — DX DG CHEST 1V PORT
1 series · 1 of 1 positions shown · non-contrast
Comparison: January 29, 2011

CLINICAL DATA: Cough and fever

EXAM:
PORTABLE CHEST 1 VIEW

[chest ap]
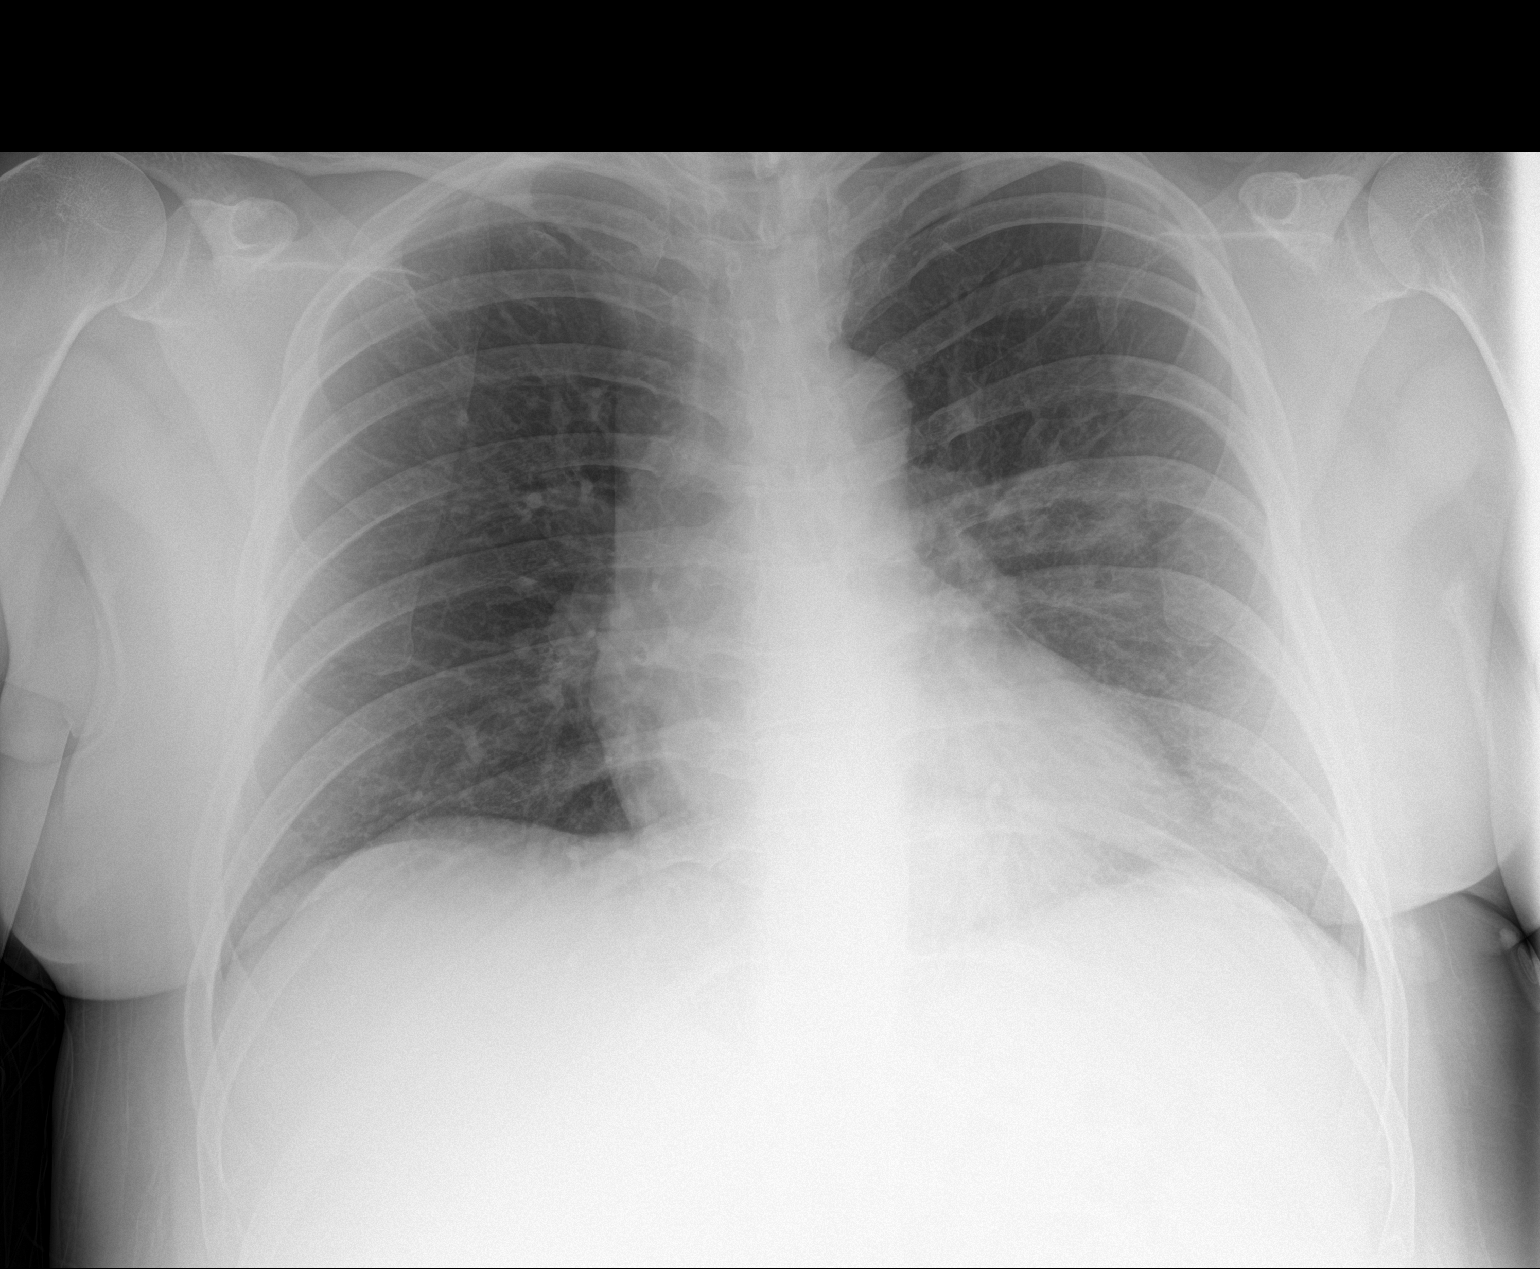

[1 of 1 positions shown; findings below may reference images not displayed]

FINDINGS: The cardiomediastinal silhouette is normal in contour. No pleural
effusion. No pneumothorax. Faint LEFT perihilar reticulonodular
opacities. Visualized abdomen is unremarkable. No acute osseous
abnormality noted.
IMPRESSION: Faint LEFT perihilar reticulonodular opacities could reflect
atypical/viral pneumonia versus aspiration or atelectasis.

## 2022-10-14 ENCOUNTER — Emergency Department (HOSPITAL_COMMUNITY): Payer: BC Managed Care – PPO

## 2022-10-14 ENCOUNTER — Other Ambulatory Visit: Payer: Self-pay

## 2022-10-14 ENCOUNTER — Emergency Department (HOSPITAL_COMMUNITY)
Admission: EM | Admit: 2022-10-14 | Discharge: 2022-10-15 | Discharge status: Against Medical Advice | Payer: BC Managed Care – PPO | Attending: Emergency Medicine | Admitting: Emergency Medicine

## 2022-10-14 DIAGNOSIS — R110 Nausea: Secondary | ICD-10-CM | POA: Insufficient documentation

## 2022-10-14 DIAGNOSIS — Z5321 Procedure and treatment not carried out due to patient leaving prior to being seen by health care provider: Secondary | ICD-10-CM | POA: Insufficient documentation

## 2022-10-14 DIAGNOSIS — R197 Diarrhea, unspecified: Secondary | ICD-10-CM | POA: Diagnosis not present

## 2022-10-14 DIAGNOSIS — R1032 Left lower quadrant pain: Secondary | ICD-10-CM | POA: Insufficient documentation

## 2022-10-14 LAB — URINALYSIS, ROUTINE W REFLEX MICROSCOPIC
Bilirubin Urine: NEGATIVE
Glucose, UA: NEGATIVE mg/dL
Hgb urine dipstick: NEGATIVE
Ketones, ur: NEGATIVE mg/dL
Leukocytes,Ua: NEGATIVE
Nitrite: NEGATIVE
Protein, ur: NEGATIVE mg/dL
Specific Gravity, Urine: 1.023 (ref 1.005–1.030)
pH: 5 (ref 5.0–8.0)

## 2022-10-14 LAB — CBC
HCT: 42.6 % (ref 36.0–46.0)
Hemoglobin: 14.1 g/dL (ref 12.0–15.0)
MCH: 29.3 pg (ref 26.0–34.0)
MCHC: 33.1 g/dL (ref 30.0–36.0)
MCV: 88.6 fL (ref 80.0–100.0)
Platelets: 264 10*3/uL (ref 150–400)
RBC: 4.81 MIL/uL (ref 3.87–5.11)
RDW: 12 % (ref 11.5–15.5)
WBC: 11.3 10*3/uL — ABNORMAL HIGH (ref 4.0–10.5)
nRBC: 0 % (ref 0.0–0.2)

## 2022-10-14 LAB — COMPREHENSIVE METABOLIC PANEL
ALT: 22 U/L (ref 0–44)
AST: 18 U/L (ref 15–41)
Albumin: 4 g/dL (ref 3.5–5.0)
Alkaline Phosphatase: 52 U/L (ref 38–126)
Anion gap: 9 (ref 5–15)
BUN: 9 mg/dL (ref 6–20)
CO2: 26 mmol/L (ref 22–32)
Calcium: 9.2 mg/dL (ref 8.9–10.3)
Chloride: 106 mmol/L (ref 98–111)
Creatinine, Ser: 0.82 mg/dL (ref 0.44–1.00)
GFR, Estimated: >60 mL/min (ref >60)
Glucose, Bld: 116 mg/dL — ABNORMAL HIGH (ref 70–99)
Potassium: 3.8 mmol/L (ref 3.5–5.1)
Sodium: 141 mmol/L (ref 135–145)
Total Bilirubin: 0.4 mg/dL (ref 0.3–1.2)
Total Protein: 6.7 g/dL (ref 6.5–8.1)

## 2022-10-14 LAB — I-STAT BETA HCG BLOOD, ED (MC, WL, AP ONLY): I-stat hCG, quantitative: <5 m[IU]/mL (ref <5)

## 2022-10-14 LAB — LIPASE, BLOOD: Lipase: 32 U/L (ref 11–51)

## 2022-10-14 MED ORDER — IOHEXOL 350 MG/ML SOLN
75.0000 mL | Freq: Once | INTRAVENOUS | Status: AC | PRN
  Administered 2022-10-14: 75 mL via INTRAVENOUS

## 2022-10-14 NOTE — ED Provider Triage Note (Signed)
Emergency Medicine Provider Triage Evaluation Note  Loretta Hood , a 46 y.o. female  was evaluated in triage.  Pt complains of abdominal pain.  Patient reports she began having diarrhea on Thursday patient reports the diarrhea was neon green.  Patient reports she had some relief with Tums patient reports increased pain lower abdomen on the left side  Review of Systems  Positive: Diarrhea Negative: Fever  Physical Exam  BP (!) 142/100 (BP Location: Right Arm)   Pulse 79   Temp 98.7 F (37.1 C)   Resp 16   SpO2 100%  Gen:   Awake, no distress   Resp:  Normal effort  MSK:   Moves extremities without difficulty  Other:  Abdomen tender left lower abdomen   Medical Decision Making  Medically screening exam initiated at 2:58 PM.  Appropriate orders placed.  Loretta Hood was informed that the remainder of the evaluation will be completed by another provider, this initial triage assessment does not replace that evaluation, and the importance of remaining in the ED until their evaluation is complete.     Elson Areas, [place] 10/14/22 1500

## 2022-10-14 NOTE — ED Triage Notes (Signed)
Pt with central abdominal pain radiating to L side and flank. Endorses nausea and diarrhea. Diarrhea present since Thursday and pain since Saturday. Pain worse after eating and with deep breaths but remains painful constantly. Endorses feelings of indigestion as well-taking Tums and drinking baking soda without relief.

## 2022-10-15 NOTE — ED Notes (Signed)
Patient IV removed by this NT. Patient states she will follow up with doctor regarding test results

## 2023-08-06 ENCOUNTER — Emergency Department (HOSPITAL_BASED_OUTPATIENT_CLINIC_OR_DEPARTMENT_OTHER): Payer: BC Managed Care – PPO

## 2023-08-06 ENCOUNTER — Other Ambulatory Visit: Payer: Self-pay

## 2023-08-06 ENCOUNTER — Encounter (HOSPITAL_BASED_OUTPATIENT_CLINIC_OR_DEPARTMENT_OTHER): Payer: Self-pay | Admitting: Emergency Medicine

## 2023-08-06 ENCOUNTER — Emergency Department (HOSPITAL_BASED_OUTPATIENT_CLINIC_OR_DEPARTMENT_OTHER)
Admission: EM | Admit: 2023-08-06 | Discharge: 2023-08-06 | Disposition: A | Discharge status: Home / Self Care | Payer: BC Managed Care – PPO | Attending: Emergency Medicine | Admitting: Emergency Medicine

## 2023-08-06 ENCOUNTER — Other Ambulatory Visit (HOSPITAL_BASED_OUTPATIENT_CLINIC_OR_DEPARTMENT_OTHER): Payer: Self-pay

## 2023-08-06 DIAGNOSIS — J986 Disorders of diaphragm: Secondary | ICD-10-CM | POA: Diagnosis not present

## 2023-08-06 DIAGNOSIS — J45909 Unspecified asthma, uncomplicated: Secondary | ICD-10-CM | POA: Diagnosis not present

## 2023-08-06 DIAGNOSIS — I10 Essential (primary) hypertension: Secondary | ICD-10-CM | POA: Diagnosis not present

## 2023-08-06 DIAGNOSIS — R079 Chest pain, unspecified: Secondary | ICD-10-CM | POA: Insufficient documentation

## 2023-08-06 LAB — CBC WITH DIFFERENTIAL/PLATELET
Abs Immature Granulocytes: 0.03 10*3/uL (ref 0.00–0.07)
Basophils Absolute: 0.1 10*3/uL (ref 0.0–0.1)
Basophils Relative: 1 %
Eosinophils Absolute: 0.6 10*3/uL — ABNORMAL HIGH (ref 0.0–0.5)
Eosinophils Relative: 8 %
HCT: 38.4 % (ref 36.0–46.0)
Hemoglobin: 12.5 g/dL (ref 12.0–15.0)
Immature Granulocytes: 0 %
Lymphocytes Relative: 28 %
Lymphs Abs: 2.4 10*3/uL (ref 0.7–4.0)
MCH: 29.3 pg (ref 26.0–34.0)
MCHC: 32.6 g/dL (ref 30.0–36.0)
MCV: 90.1 fL (ref 80.0–100.0)
Monocytes Absolute: 0.7 10*3/uL (ref 0.1–1.0)
Monocytes Relative: 8 %
Neutro Abs: 4.7 10*3/uL (ref 1.7–7.7)
Neutrophils Relative %: 55 %
Platelets: 261 10*3/uL (ref 150–400)
RBC: 4.26 MIL/uL (ref 3.87–5.11)
RDW: 12.6 % (ref 11.5–15.5)
WBC: 8.6 10*3/uL (ref 4.0–10.5)
nRBC: 0 % (ref 0.0–0.2)

## 2023-08-06 LAB — COMPREHENSIVE METABOLIC PANEL
ALT: 25 U/L (ref 0–44)
AST: 18 U/L (ref 15–41)
Albumin: 3.9 g/dL (ref 3.5–5.0)
Alkaline Phosphatase: 56 U/L (ref 38–126)
Anion gap: 7 (ref 5–15)
BUN: 13 mg/dL (ref 6–20)
CO2: 27 mmol/L (ref 22–32)
Calcium: 8.7 mg/dL — ABNORMAL LOW (ref 8.9–10.3)
Chloride: 104 mmol/L (ref 98–111)
Creatinine, Ser: 0.9 mg/dL (ref 0.44–1.00)
GFR, Estimated: >60 mL/min (ref >60)
Glucose, Bld: 106 mg/dL — ABNORMAL HIGH (ref 70–99)
Potassium: 3.8 mmol/L (ref 3.5–5.1)
Sodium: 138 mmol/L (ref 135–145)
Total Bilirubin: 0.5 mg/dL (ref 0.3–1.2)
Total Protein: 6.8 g/dL (ref 6.5–8.1)

## 2023-08-06 LAB — PREGNANCY, URINE: Preg Test, Ur: NEGATIVE

## 2023-08-06 LAB — TROPONIN I (HIGH SENSITIVITY)
Troponin I (High Sensitivity): <2 ng/L (ref <18)
Troponin I (High Sensitivity): <2 ng/L (ref <18)

## 2023-08-06 LAB — D-DIMER, QUANTITATIVE: D-Dimer, Quant: <0.27 ug{FEU}/mL (ref 0.00–0.50)

## 2023-08-06 MED ORDER — PANTOPRAZOLE SODIUM 20 MG PO TBEC
40.0000 mg | DELAYED_RELEASE_TABLET | Freq: Every day | ORAL | 0 refills | Status: AC
  Filled 2023-08-06: qty 20, 10d supply, fill #0

## 2023-08-06 NOTE — ED Triage Notes (Addendum)
Pt states she started to have indigestion yesterday.  Today she started to have right shoulder pain, then chest pain with some sob.  Pt denies any long trips, hormone use or tobacco use.   Some nausea yesterday.  Pt admits to diarrhea yesterday.  Pt took omeprazole and tums yesterday

## 2023-08-06 NOTE — ED Provider Notes (Signed)
[place] [facility] Provider Note   CSN: [id number] Arrival date & time: 08/06/23  7829     History  Chief Complaint  Patient presents with   Chest Pain    Loretta Hood is a 47 y.o. female.  HPI     47 year old female with a history of hypertension, bipolar disorder, PTSD, seizures, history of prior alcohol and drug dependence, asthma presents with concern for chest pain and shortness of breath.   Yesterday severe indigestion, has not had it in a while This AM had pain in right shoulder blade and then radiating to the right side of chest, then would go away then come back, both arms tingling. Back of shoulder not consistent pain, aching pain but took breath away. Was with a client and about to step away and when walking to anther room felt short of breath.  Told a friend and then came here.  Thinks it could be anxiety, in school and has had some since then.  Nausea yesterday but not today, no vomiting.  No dizziness.  No abdominal pain or back pain. Taking a deep breath makes it feel better.   Was standing up. 3/10 now   No leg pain or swelling, no long trips/recent surgeries, no hormone use.    No smoking, occ etoh, no other drugs (prior use, no longer)  Dad and both grandparents with heart disease , dad early 83s. No hx of blood clots.   Past Medical History:  Diagnosis Date   Alcoholic (HCC)    Anxiety    Asthma    Bipolar 1 disorder (HCC)    Depression    Drug addiction in remission [facility])    H/O suicide attempt 2005   overdose   PTSD (post-traumatic stress disorder)    Seizures (HCC)    started in Dec 2015 - states last seizure 12/08/14 - has not seen neurologist      Home Medications Prior to Admission medications   Medication Sig Start Date End Date Taking? Authorizing Provider  acetaminophen (TYLENOL) 500 MG tablet Take 500 mg by mouth as needed for fever, mild pain or headache.   Yes [provider]   acyclovir (ZOVIRAX) 400 MG tablet Take 400 mg by mouth as needed (break outs). 08/05/23  Yes [provider]  albuterol (VENTOLIN HFA) 108 (90 Base) MCG/ACT inhaler Inhale 2 puffs into the lungs as needed for wheezing or shortness of breath. 08/05/23  Yes [provider]  Ascorbic Acid (VITAMIN C PO) Take 1 tablet by mouth daily.   Yes [provider]  B Complex Vitamins (VITAMIN B COMPLEX PO) Take 1 tablet by mouth daily.   Yes [provider]  buPROPion (WELLBUTRIN XL) 300 MG 24 hr tablet Take 300 mg by mouth daily. 08/02/23  Yes [provider]  busPIRone (BUSPAR) 7.5 MG tablet Take 7.5 mg by mouth as needed (anxiety). 06/19/23  Yes [provider]  cetirizine (ZYRTEC) 10 MG tablet Take 10 mg by mouth daily.   Yes [provider]  Cholecalciferol (VITAMIN D3) 1.25 MG (50000 UT) CAPS Take 50,000 Units by mouth once a week. Sundays 06/19/23  Yes [provider]  Cyanocobalamin (VITAMIN B-12 PO) Take 1 capsule by mouth daily.   Yes [provider]  cyanocobalamin (VITAMIN B12) 1000 MCG/ML injection Inject 1,000 mcg into the muscle every 30 (thirty) days. 06/19/23  Yes [provider]  lisinopril (ZESTRIL) 10 MG tablet Take 10 mg by mouth  daily. 05/26/23  Yes [provider]  Magnesium 250 MG TABS Take 250 mg by mouth daily. Gummies   Yes [provider]  pantoprazole (PROTONIX) 20 MG tablet Take 2 tablets (40 mg total) by mouth daily for 10 days. 08/06/23 08/16/23 Yes Shrika Milos, Denny Peon, MD  TRULICITY 0.75 MG/0.5ML SOPN Inject 0.75 mg into the skin once a week. Sundays 06/19/23  Yes [provider]  VRAYLAR 1.5 MG capsule Take 1.5 mg by mouth daily. 08/02/23  Yes [provider]  dicyclomine (BENTYL) 10 MG capsule Take 2 capsules (20 mg total) by mouth 4 (four) times daily as needed for spasms. Patient not taking: Reported on 11/18/2020 08/15/16   Pisciotta, Joni Reining, PA-C  doxycycline  (VIBRAMYCIN) 100 MG capsule Take 1 capsule (100 mg total) by mouth 2 (two) times daily. Patient not taking: Reported on 08/06/2023 11/18/20   Tanda Rockers, PA-C  naproxen (NAPROSYN) 500 MG tablet Take 1 tablet (500 mg total) by mouth 2 (two) times daily as needed (for back pain). Patient not taking: Reported on 11/18/2020 11/02/16   Molpus, Jonny Ruiz, MD  ondansetron (ZOFRAN ODT) 8 MG disintegrating tablet Take 1 tablet (8 mg total) by mouth every 8 (eight) hours as needed for nausea or vomiting. Patient not taking: Reported on 11/18/2020 11/02/16   Molpus, Jonny Ruiz, MD  promethazine (PHENERGAN) 25 MG tablet Take 1 tablet (25 mg total) by mouth every 6 (six) hours as needed for nausea or vomiting. Patient not taking: Reported on 11/18/2020 08/15/16   Pisciotta, Joni Reining, PA-C      Allergies    Morphine and codeine, Prednisone, and Seroquel [quetiapine fumarate]    Review of Systems   Review of Systems  Physical Exam Updated Vital Signs BP 112/67   Pulse 71   Temp 98 F (36.7 C) (Oral)   Resp 16   Ht 5\' 3"  (1.6 m)   Wt 90.7 kg   SpO2 97%   BMI 35.43 kg/m  Physical Exam  ED Results / Procedures / Treatments   Labs (all labs ordered are listed, but only abnormal results are displayed) Labs Reviewed  CBC WITH DIFFERENTIAL/PLATELET - Abnormal; Notable for the following components:      Result Value   Eosinophils Absolute 0.6 (*)    All other components within normal limits  COMPREHENSIVE METABOLIC PANEL - Abnormal; Notable for the following components:   Glucose, Bld 106 (*)    Calcium 8.7 (*)    All other components within normal limits  PREGNANCY, URINE  D-DIMER, QUANTITATIVE  TROPONIN I (HIGH SENSITIVITY)  TROPONIN I (HIGH SENSITIVITY)    EKG EKG Interpretation Date/Time:  Wednesday August 06 2023 09:18:30 EDT Ventricular Rate:  76 PR Interval:  139 QRS Duration:  93 QT Interval:  371 QTC Calculation: 418 R Axis:   63  Text Interpretation: Sinus rhythm Probable left  atrial enlargement Baseline wander in lead(s) II III aVF V3 No significant change since last tracing Confirmed by Alvira Monday ([id number]) on 08/06/2023 11:13:59 AM  Radiology DG Chest 2 View  Result Date: 08/06/2023 CLINICAL DATA:  chest pain EXAM: CHEST - 2 VIEW COMPARISON:  01/14/2022. FINDINGS: Bilateral lung fields are clear. Note is made of elevated right hemidiaphragm. Bilateral costophrenic angles are clear. Normal cardio-mediastinal silhouette. No acute osseous abnormalities. The soft tissues are within normal limits. IMPRESSION: Elevated right hemidiaphragm. No active disease. Electronically Signed   By: Jules Schick M.D.   On: 08/06/2023 10:09    Procedures Procedures    Medications Ordered in ED  Medications - No data to display  ED Course/ Medical Decision Making/ A&P      47 year old female with a history of hypertension, bipolar disorder, PTSD, seizures, prior history of alcohol and drug dependence, asthma presents with concern for chest pain and shortness of breath.  Differential diagnosis for chest pain includes pulmonary embolus, dissection, pneumothorax, pneumonia, ACS, myocarditis, pericarditis.    EKG was done and evaluate by me and showed no acute ST changes and no signs of pericarditis. Chest x-ray was done and evaluated by me and radiology and showed no sign of pneumonia or pneumothorax--does show elevation of right hemidiaphragm with no previous XR for comparison--do not suspect emergent pathology relating to this--however recommend PCP follow up.  She is low risk [place] with a negative ddimer and have low suspicion for PE.  Patient is low risk HEART score and had delta troponins which were both negative.  Do not feel history or exam are consistent with aortic dissection in addition to XR and ddimer being negative.   Recommend PCP follow up. Will start pantoprazole. Discussed reasons to return. Patient discharged in stable condition with understanding of reasons to  return.        Final Clinical Impression(s) / ED Diagnoses Final diagnoses:  Nonspecific chest pain  Elevated hemidiaphragm    Rx / DC Orders ED Discharge Orders          Ordered    pantoprazole (PROTONIX) 20 MG tablet  Daily        08/06/23 1239              Alvira Monday, MD 08/08/23 0532

## 2023-08-19 ENCOUNTER — Other Ambulatory Visit (HOSPITAL_BASED_OUTPATIENT_CLINIC_OR_DEPARTMENT_OTHER): Payer: Self-pay

## 2023-12-09 ENCOUNTER — Other Ambulatory Visit (HOSPITAL_BASED_OUTPATIENT_CLINIC_OR_DEPARTMENT_OTHER): Payer: Self-pay

## 2024-01-08 ENCOUNTER — Emergency Department (HOSPITAL_BASED_OUTPATIENT_CLINIC_OR_DEPARTMENT_OTHER)
Admission: EM | Admit: 2024-01-08 | Discharge: 2024-01-08 | Disposition: A | Discharge status: Home / Self Care | Payer: BC Managed Care – PPO | Attending: Emergency Medicine | Admitting: Emergency Medicine

## 2024-01-08 ENCOUNTER — Emergency Department (HOSPITAL_BASED_OUTPATIENT_CLINIC_OR_DEPARTMENT_OTHER): Payer: BC Managed Care – PPO

## 2024-01-08 ENCOUNTER — Other Ambulatory Visit: Payer: Self-pay

## 2024-01-08 DIAGNOSIS — R197 Diarrhea, unspecified: Secondary | ICD-10-CM | POA: Insufficient documentation

## 2024-01-08 DIAGNOSIS — R112 Nausea with vomiting, unspecified: Secondary | ICD-10-CM | POA: Diagnosis not present

## 2024-01-08 DIAGNOSIS — J45909 Unspecified asthma, uncomplicated: Secondary | ICD-10-CM | POA: Diagnosis not present

## 2024-01-08 DIAGNOSIS — R1084 Generalized abdominal pain: Secondary | ICD-10-CM | POA: Diagnosis not present

## 2024-01-08 DIAGNOSIS — R1013 Epigastric pain: Secondary | ICD-10-CM | POA: Diagnosis present

## 2024-01-08 LAB — COMPREHENSIVE METABOLIC PANEL
ALT: 22 U/L (ref 0–44)
AST: 17 U/L (ref 15–41)
Albumin: 3.9 g/dL (ref 3.5–5.0)
Alkaline Phosphatase: 54 U/L (ref 38–126)
Anion gap: 7 (ref 5–15)
BUN: 9 mg/dL (ref 6–20)
CO2: 25 mmol/L (ref 22–32)
Calcium: 9 mg/dL (ref 8.9–10.3)
Chloride: 107 mmol/L (ref 98–111)
Creatinine, Ser: 0.91 mg/dL (ref 0.44–1.00)
GFR, Estimated: >60 mL/min (ref >60)
Glucose, Bld: 107 mg/dL — ABNORMAL HIGH (ref 70–99)
Potassium: 3.4 mmol/L — ABNORMAL LOW (ref 3.5–5.1)
Sodium: 139 mmol/L (ref 135–145)
Total Bilirubin: 0.4 mg/dL (ref 0.0–1.2)
Total Protein: 6.8 g/dL (ref 6.5–8.1)

## 2024-01-08 LAB — URINALYSIS, ROUTINE W REFLEX MICROSCOPIC
Bilirubin Urine: NEGATIVE
Glucose, UA: NEGATIVE mg/dL
Hgb urine dipstick: NEGATIVE
Ketones, ur: NEGATIVE mg/dL
Nitrite: NEGATIVE
Protein, ur: NEGATIVE mg/dL
Specific Gravity, Urine: >=1.03 (ref 1.005–1.030)
pH: 5.5 (ref 5.0–8.0)

## 2024-01-08 LAB — CBC
HCT: 44 % (ref 36.0–46.0)
Hemoglobin: 14.4 g/dL (ref 12.0–15.0)
MCH: 29.1 pg (ref 26.0–34.0)
MCHC: 32.7 g/dL (ref 30.0–36.0)
MCV: 89.1 fL (ref 80.0–100.0)
Platelets: 338 10*3/uL (ref 150–400)
RBC: 4.94 MIL/uL (ref 3.87–5.11)
RDW: 12.3 % (ref 11.5–15.5)
WBC: 12.6 10*3/uL — ABNORMAL HIGH (ref 4.0–10.5)
nRBC: 0 % (ref 0.0–0.2)

## 2024-01-08 LAB — URINALYSIS, MICROSCOPIC (REFLEX): RBC / HPF: NONE SEEN RBC/hpf (ref 0–5)

## 2024-01-08 LAB — PREGNANCY, URINE: Preg Test, Ur: NEGATIVE

## 2024-01-08 LAB — LIPASE, BLOOD: Lipase: 65 U/L — ABNORMAL HIGH (ref 11–51)

## 2024-01-08 MED ORDER — DICYCLOMINE HCL 20 MG PO TABS: 20.0000 mg | ORAL_TABLET | Freq: Three times a day (TID) | ORAL | 0 refills | Status: AC | PRN

## 2024-01-08 MED ORDER — LOPERAMIDE HCL 2 MG PO CAPS: 2.0000 mg | ORAL_CAPSULE | Freq: Four times a day (QID) | ORAL | 0 refills | Status: AC | PRN

## 2024-01-08 MED ORDER — LOPERAMIDE HCL 2 MG PO CAPS: 2.0000 mg | ORAL_CAPSULE | Freq: Four times a day (QID) | ORAL | 0 refills | Status: DC | PRN

## 2024-01-08 MED ORDER — ONDANSETRON 4 MG PO TBDP: 4.0000 mg | ORAL_TABLET | Freq: Three times a day (TID) | ORAL | 0 refills | Status: DC | PRN

## 2024-01-08 MED ORDER — IOHEXOL 300 MG/ML  SOLN
100.0000 mL | Freq: Once | INTRAMUSCULAR | Status: AC | PRN
  Administered 2024-01-08: 100 mL via INTRAVENOUS

## 2024-01-08 MED ORDER — DICYCLOMINE HCL 20 MG PO TABS: 20.0000 mg | ORAL_TABLET | Freq: Three times a day (TID) | ORAL | 0 refills | Status: DC | PRN

## 2024-01-08 MED ORDER — ONDANSETRON 4 MG PO TBDP: 4.0000 mg | ORAL_TABLET | Freq: Three times a day (TID) | ORAL | 0 refills | Status: AC | PRN

## 2024-01-08 MED ORDER — SODIUM CHLORIDE 0.9 % IV BOLUS
1000.0000 mL | Freq: Once | INTRAVENOUS | Status: AC
  Administered 2024-01-08: 1000 mL via INTRAVENOUS

## 2024-01-08 NOTE — ED Triage Notes (Signed)
 Pt states that she is on [facility] for the last three months and had dose on Thursday. Beginning Friday pt had N/V/D, belching, sour taste in her mouth, and generalized abd pain. Pt states that abd pain radiates to her R back. Denies urinary symptoms. Pt states that she has been taking omeprazole  and zofran  without relief.

## 2024-01-08 NOTE — Discharge Instructions (Signed)
You have been seen in the Emergency Department (ED) today for nausea and vomiting.  Your work up today has not shown a clear cause for your symptoms. °You have been prescribed Zofran; please use as prescribed as needed for your nausea. ° °Follow up with your doctor as soon as possible regarding today?s emergent visit and your symptoms of nausea.  ° °Return to the ED if you develop abdominal, bloody vomiting, bloody diarrhea, if you are unable to tolerate fluids due to vomiting, or if you develop other symptoms that concern you. ° °

## 2024-01-08 NOTE — ED Provider Notes (Signed)
 Emergency Department Provider Note   I have reviewed the triage vital signs and the nursing notes.   HISTORY  Chief Complaint Abdominal Pain   HPI Loretta Hood is a 48 y.o. female with past history reviewed below presents emergency department with epigastric and left upper quadrant abdominal pain with vomiting since increasing her Wegovy dose.  She has been on this medication for the past 3 months and titrating up on the dosage.  She has developed vomiting, diarrhea, abdominal discomfort.  She has foul tasting belching as well. No fever.   Past Medical History:  Diagnosis Date   Alcoholic (HCC)    Anxiety    Asthma    Bipolar 1 disorder (HCC)    Depression    Drug addiction in remission [facility])    H/O suicide attempt 2005   overdose   PTSD (post-traumatic stress disorder)    Seizures (HCC)    started in Dec 2015 - states last seizure 12/08/14 - has not seen neurologist     Review of Systems  Constitutional: No fever/chills Cardiovascular: Denies chest pain. Respiratory: Denies shortness of breath. Gastrointestinal: Positive abdominal pain. Positive nausea and vomiting.  No diarrhea.  Musculoskeletal: Negative for back pain. Skin: Negative for rash. Neurological: Negative for headaches. ____________________________________________   PHYSICAL EXAM:  VITAL SIGNS: ED Triage Vitals [01/08/24 1621]  Encounter Vitals Group     BP 110/85     Pulse Rate 93     Resp 16     Temp 98.2 F (36.8 C)     Temp Source Oral     SpO2 100 %     Weight 172 lb (78 kg)     Height 5' 3 (1.6 m)   Constitutional: Alert and oriented. Well appearing and in no acute distress. Eyes: Conjunctivae are normal. Head: Atraumatic. Nose: No congestion/rhinnorhea. Mouth/Throat: Mucous membranes are moist.  Oropharynx non-erythematous. Neck: No stridor.   Cardiovascular: Normal rate, regular rhythm. Good peripheral circulation. Grossly normal heart sounds.   Respiratory: Normal  respiratory effort.  No retractions. Lungs CTAB. Gastrointestinal: Soft with mild epigastric and LUQ tenderness. No peritonitis. No distention.  Musculoskeletal: No lower extremity tenderness nor edema. No gross deformities of extremities. Neurologic:  Normal speech and language. No gross focal neurologic deficits are appreciated.  Skin:  Skin is warm, dry and intact. No rash noted.  ____________________________________________   LABS (all labs ordered are listed, but only abnormal results are displayed)  Labs Reviewed  LIPASE, BLOOD - Abnormal; Notable for the following components:      Result Value   Lipase 65 (*)    All other components within normal limits  COMPREHENSIVE METABOLIC PANEL - Abnormal; Notable for the following components:   Potassium 3.4 (*)    Glucose, Bld 107 (*)    All other components within normal limits  CBC - Abnormal; Notable for the following components:   WBC 12.6 (*)    All other components within normal limits  URINALYSIS, ROUTINE W REFLEX MICROSCOPIC - Abnormal; Notable for the following components:   Leukocytes,Ua TRACE (*)    All other components within normal limits  URINALYSIS, MICROSCOPIC (REFLEX) - Abnormal; Notable for the following components:   Bacteria, UA RARE (*)    All other components within normal limits  PREGNANCY, URINE    ____________________________________________   PROCEDURES  Procedure(s) performed:   Procedures  None ____________________________________________   INITIAL IMPRESSION / ASSESSMENT AND PLAN / ED COURSE  Pertinent labs & imaging results that were  available during my care of the patient were reviewed by me and considered in my medical decision making (see chart for details).   This patient is Presenting for Evaluation of abdominal pain, which does require a range of treatment options, and is a complaint that involves a high risk of morbidity and mortality.  The Differential Diagnoses includes but is  not exclusive to acute cholecystitis, intrathoracic causes for epigastric abdominal pain, gastritis, duodenitis, pancreatitis, small bowel or large bowel obstruction, abdominal aortic aneurysm, hernia, gastritis, etc.   Critical Interventions-    Medications  sodium chloride  0.9 % bolus 1,000 mL (0 mLs Intravenous Stopped 01/08/24 2036)  iohexol  (OMNIPAQUE ) 300 MG/ML solution 100 mL (100 mLs Intravenous Contrast Given 01/08/24 1913)    Reassessment after intervention:  symptoms improved.     Clinical Laboratory Tests Ordered, included CMP includes normal LFTs. No AKI. Lipase negative. CBC with leukocytosis.   Radiologic Tests Ordered, included CT abdomen/pelvis. I independently interpreted the images and agree with radiology interpretation.   Cardiac Monitor Tracing which shows NSR.    Social Determinants of Health Risk patient is not an active smoker.   Medical Decision Making: Summary:  Patient presents to the ED with abdominal pain. No AKI. Normal LFts. Suspect GI viral symptoms. Symptoms resolved with Zofran . Patient feeling well. Stable for discharge with close follow up.   Patient's presentation is most consistent with acute presentation with potential threat to life or bodily function.   Disposition: discharge  ____________________________________________  FINAL CLINICAL IMPRESSION(S) / ED DIAGNOSES  Final diagnoses:  Generalized abdominal pain  Nausea vomiting and diarrhea     NEW OUTPATIENT MEDICATIONS STARTED DURING THIS VISIT:  Discharge Medication List as of 01/08/2024  8:12 PM     START taking these medications   Details  dicyclomine  (BENTYL ) 20 MG tablet Take 1 tablet (20 mg total) by mouth 3 (three) times daily as needed., Starting Thu 01/08/2024, Normal    loperamide  (IMODIUM ) 2 MG capsule Take 1 capsule (2 mg total) by mouth 4 (four) times daily as needed for diarrhea or loose stools., Starting Thu 01/08/2024, Normal        Note:  This document was prepared  using Dragon voice recognition software and may include unintentional dictation errors.  Fonda Law, MD, [facility]    Gearl Kimbrough, Fonda MATSU, MD 01/14/24 [phone]

## 2024-01-08 NOTE — ED Notes (Signed)
 Patient transported to CT

## 2025-02-04 ENCOUNTER — Institutional Professional Consult (permissible substitution)
